# Patient Record
Sex: Male | Born: 1960 | Race: White | Hispanic: No | Marital: Married | Smoking: Current every day smoker
Health system: Southern US, Community
[De-identification: ages and names within clinical notes are randomized; demographics above are authoritative.]

## PROBLEM LIST (undated history)

## (undated) DIAGNOSIS — I10 Essential (primary) hypertension: Secondary | ICD-10-CM

## (undated) HISTORY — PX: NO PAST SURGERIES: SHX2092

---

## 2017-07-25 ENCOUNTER — Emergency Department
Admission: EM | Admit: 2017-07-25 | Discharge: 2017-07-25 | Disposition: A | Discharge status: Home / Self Care | Payer: 59 | Source: Home / Self Care | Attending: Emergency Medicine | Admitting: Emergency Medicine

## 2017-07-25 ENCOUNTER — Encounter: Payer: Self-pay | Admitting: Emergency Medicine

## 2017-07-25 DIAGNOSIS — L02512 Cutaneous abscess of left hand: Secondary | ICD-10-CM

## 2017-07-25 DIAGNOSIS — Z23 Encounter for immunization: Secondary | ICD-10-CM

## 2017-07-25 DIAGNOSIS — L03012 Cellulitis of left finger: Secondary | ICD-10-CM | POA: Diagnosis not present

## 2017-07-25 HISTORY — DX: Essential (primary) hypertension: I10

## 2017-07-25 MED ORDER — DOXYCYCLINE HYCLATE 100 MG PO CAPS: 100.0000 mg | ORAL_CAPSULE | Freq: Two times a day (BID) | ORAL | 0 refills | Status: DC

## 2017-07-25 MED ORDER — TETANUS-DIPHTH-ACELL PERTUSSIS 5-2.5-18.5 LF-MCG/0.5 IM SUSP
0.5000 mL | Freq: Once | INTRAMUSCULAR | Status: AC
  Administered 2017-07-25: 0.5 mL via INTRAMUSCULAR

## 2017-07-25 NOTE — ED Triage Notes (Signed)
[facility]/O pain in the left middle finger. States he was playing golf yesterday and noticed a pimple on his finger he opened it and woke this morning with pain and edema into the left hand.

## 2017-07-25 NOTE — ED Provider Notes (Signed)
[facility]    CSN: [id number] Arrival date & time: 07/25/17  1111     History   Chief Complaint Chief Complaint  Patient presents with  . Recurrent Skin Infections    HPI Matthew RevereWilliam Hoge is a 57 y.o. male.   HPI Patient was in his usual state of health until yesterday when he was going to play golf and noticed a pimple over the back of his left middle finger.He used a needle and was able to express a small amount of purulent material. Over the evening he has had progressive pain and swelling proximal to the pimple. He has not been ill with fevers chills and has good range of motion of his fingers and hand. Past Medical History:  Diagnosis Date  . Hypertension     There are no active problems to display for this patient.   History reviewed. No pertinent surgical history.     Home Medications    Prior to Admission medications   Medication Sig Start Date End Date Taking? Authorizing Provider  lisinopril (PRINIVIL,ZESTRIL) 20 MG tablet Take 20 mg by mouth daily.   Yes [provider]    Family History History reviewed. No pertinent family history.  Social History Social History   Tobacco Use  . Smoking status: Current Every Day Smoker  . Smokeless tobacco: Never Used  Substance Use Topics  . Alcohol use: Yes    Alcohol/week: 7.2 oz    Types: 12 Cans of beer per week  . Drug use: No     Allergies   Patient has no allergy information on record.   Review of Systems Review of Systems  Constitutional: Negative for fever.  Musculoskeletal:       Pain and swelling of his left middle finger. Swelling of the area around the third metacarpal head  Skin: Positive for rash and wound.     Physical Exam Triage Vital Signs ED Triage Vitals  Enc Vitals Group     BP 07/25/17 1132 (!) 163/93     Pulse Rate 07/25/17 1132 85     Resp 07/25/17 1132 16     Temp 07/25/17 1132 98.1 F (36.7 C)     Temp Source 07/25/17 1132 Oral     SpO2  07/25/17 1132 97 %     Weight 07/25/17 1133 223 lb 8 oz (101.4 kg)     Height 07/25/17 1133 5\' 9"  (1.753 m)     Head Circumference --      Peak Flow --      Pain Score 07/25/17 1133 3     Pain Loc --      Pain Edu? --      Excl. in GC? --    No data found.  Updated Vital Signs BP (!) 163/93 (BP Location: Right Arm)   Pulse 85   Temp 98.1 F (36.7 C) (Oral)   Resp 16   Ht 5\' 9"  (1.753 m)   Wt 223 lb 8 oz (101.4 kg)   SpO2 97%   BMI 33.01 kg/m   Visual Acuity Right Eye Distance:   Left Eye Distance:   Bilateral Distance:    Right Eye Near:   Left Eye Near:    Bilateral Near:     Physical Exam  Constitutional: He appears well-developed and well-nourished.  Musculoskeletal:  There is a 5 x 5 mm pustule noted over the extensor surface proximal phalanx left middle finger. There is swelling which extends over the head of the  third metacarpal one 1/2 inches proximally. These areas were marked with a skin marker.     UC Treatments / Results  Labs (all labs ordered are listed, but only abnormal results are displayed) Labs Reviewed - No data to display  EKG  EKG Interpretation None       Radiology No results found.  Procedures Procedures (including critical care time)  Medications Ordered in UC Medications - No data to display   Initial Impression / Assessment and Plan / UC Course  I have reviewed the triage vital signs and the nursing notes.  Pertinent labs & imaging results that were available during my care of the patient were reviewed by me and considered in my medical decision making (see chart for details). Patient has a pustule over the proximal phalanx extensor surface left middle finger. There is developing cellulitis which extends over the head of the third metacarpal. Culture was taken of this area. His tetanus immunization was updated. He was started on doxycycline twice a day with instructions to present to the emergency room if he has progressive  redness and swelling.      Final Clinical Impressions(s) / UC Diagnoses   Final diagnoses:  Abscess of left middle finger  Cellulitis of finger of left hand    ED Discharge Orders    None     Keep area clean with soap and water. If you have progressive redness and swelling extending over the wrist of the hand please go to the emergency room. Take antibiotics twice a day. Please follow-up elevated blood pressure with your primary care provider Controlled Substance Prescriptions [place] Controlled Substance Registry consulted? Not Applicable   Collene Gobble, MD 07/25/17 [phone]

## 2017-07-25 NOTE — Discharge Instructions (Signed)
Keep area clean with soap and water. If you have progressive redness and swelling extending over the wrist of the hand please go to the emergency room. Take antibiotics twice a day. Please follow-up. Elevated blood pressure with your primary care provider

## 2017-07-28 ENCOUNTER — Telehealth: Payer: Self-pay | Admitting: Emergency Medicine

## 2017-07-28 LAB — WOUND CULTURE
MICRO NUMBER:: 90305871
SPECIMEN QUALITY:: ADEQUATE

## 2017-07-28 NOTE — Telephone Encounter (Signed)
Inquired about patient's status; encourage them to call with questions/concerns; no growth on culture but complete rx for prophylactic value.

## 2018-04-18 ENCOUNTER — Encounter: Payer: Self-pay | Admitting: Sports Medicine

## 2018-04-18 ENCOUNTER — Ambulatory Visit (INDEPENDENT_AMBULATORY_CARE_PROVIDER_SITE_OTHER): Payer: 59 | Admitting: Sports Medicine

## 2018-04-18 ENCOUNTER — Ambulatory Visit (INDEPENDENT_AMBULATORY_CARE_PROVIDER_SITE_OTHER): Payer: 59

## 2018-04-18 DIAGNOSIS — M7989 Other specified soft tissue disorders: Secondary | ICD-10-CM | POA: Diagnosis not present

## 2018-04-18 DIAGNOSIS — M79644 Pain in right finger(s): Secondary | ICD-10-CM

## 2018-04-18 DIAGNOSIS — M20021 Boutonniere deformity of right finger(s): Secondary | ICD-10-CM

## 2018-04-18 NOTE — Assessment & Plan Note (Signed)
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 I am unable to get passive full extension at the PIP. X-rays do show evidence of an old fracture at the dorsal base of the middle phalanx indicating likely central slip injury. I would like him to do occupational therapy with extension splinting of the PIP, leaving the DIP free. MRI of the finger to evaluate for concurrent injuries. It is important to get full extension of the PIP before considering surgical intervention. Return to see me in 4 weeks.

## 2018-04-18 NOTE — Progress Notes (Signed)
Subjective:    I'm seeing this patient as a consultation for: Dr. Sula Rumpleaniel Myers  CC: Right hand injury  HPI: This is a pleasant 57 year old male, 6 weeks ago he was walking his dog, a 100 pound black lab, it ran and part of the leash hit him in the finger.  He had immediate pain, swelling.  But no deformity.  He went to urgent care a couple of weeks later, x-rays were done that were reportedly normal though I cannot review images.  He has had persistent pain, persistent swelling with progressive deformity of the finger itself.  Pain is moderate, persistent, localized without radiation.  Declines any swelling, pain, arthralgias before the injury.  I reviewed the past medical history, family history, social history, surgical history, and allergies today and no changes were needed.  Please see the problem list section below in epic for further details.  Past Medical History: Past Medical History:  Diagnosis Date  . Hypertension    Past Surgical History: Past Surgical History:  Procedure Laterality Date  . NO PAST SURGERIES     Social History: Social History   Socioeconomic History  . Marital status: Married    Spouse name: Not on file  . Number of children: Not on file  . Years of education: Not on file  . Highest education level: Not on file  Occupational History  . Not on file  Social Needs  . Financial resource strain: Not on file  . Food insecurity:    Worry: Not on file    Inability: Not on file  . Transportation needs:    Medical: Not on file    Non-medical: Not on file  Tobacco Use  . Smoking status: Current Every Day Smoker  . Smokeless tobacco: Never Used  Substance and Sexual Activity  . Alcohol use: Yes    Alcohol/week: 12.0 standard drinks    Types: 12 Cans of beer per week  . Drug use: No  . Sexual activity: Not on file  Lifestyle  . Physical activity:    Days per week: Not on file    Minutes per session: Not on file  . Stress: Not on file    Relationships  . Social connections:    Talks on phone: Not on file    Gets together: Not on file    Attends religious service: Not on file    Active member of club or organization: Not on file    Attends meetings of clubs or organizations: Not on file    Relationship status: Not on file  Other Topics Concern  . Not on file  Social History Narrative  . Not on file   Family History: No family history on file. Allergies: No Known Allergies Medications: See med rec.  Review of Systems: No headache, visual changes, nausea, vomiting, diarrhea, constipation, dizziness, abdominal pain, skin rash, fevers, chills, night sweats, weight loss, swollen lymph nodes, body aches, joint swelling, muscle aches, chest pain, shortness of breath, mood changes, visual or auditory hallucinations.   Objective:   General: Well Developed, well nourished, and in no acute distress.  Neuro:  Extra-ocular muscles intact, able to move all 4 extremities, sensation grossly intact.  Deep tendon reflexes tested were normal. Psych: Alert and oriented, mood congruent with affect. ENT:  Ears and nose appear unremarkable.  Hearing grossly normal. Neck: Unremarkable overall appearance, trachea midline.  No visible thyroid enlargement. Eyes: Conjunctivae and lids appear unremarkable.  Pupils equal and round. Skin: Warm and dry,  no rashes noted.  Cardiovascular: Pulses palpable, no extremity edema. Right hand: Swollen third PIP, tender to palpation over the ulnar aspect of the PIP, visible boutonniere deformity, good strength to flexion and extension at the MCP, PIP, DIP with mild weakness to extension at the PIP.  Neurovascularly intact distally collaterals are stable at the PIP.  X-rays personally reviewed, there is what appears to be bony callus over the dorsum of the middle phalangeal base.  Concern for osteomyelitis by radiology but I think this is more callus from an old occult fracture.  Impression and  Recommendations:   This case required medical decision making of moderate complexity.  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 I am unable to get passive full extension at the PIP. X-rays do show evidence of an old fracture at the dorsal base of the middle phalanx indicating likely central slip injury. I would like him to do occupational therapy with extension splinting of the PIP, leaving the DIP free. MRI of the finger to evaluate for concurrent injuries. It is important to get full extension of the PIP before considering surgical intervention. Return to see me in 4 weeks. ___________________________________________ Ihor Austin. Benjamin Stain, M.D., ABFM., CAQSM. Primary Care and Sports Medicine [facility]  Adjunct Professor of Family Medicine  [employer]

## 2018-05-09 ENCOUNTER — Ambulatory Visit: Payer: 59

## 2018-05-16 ENCOUNTER — Other Ambulatory Visit: Payer: 59

## 2018-05-16 ENCOUNTER — Ambulatory Visit: Payer: 59 | Admitting: Sports Medicine

## 2018-05-17 ENCOUNTER — Ambulatory Visit (INDEPENDENT_AMBULATORY_CARE_PROVIDER_SITE_OTHER): Payer: 59 | Admitting: Sports Medicine

## 2018-05-17 ENCOUNTER — Encounter: Payer: Self-pay | Admitting: Sports Medicine

## 2018-05-17 DIAGNOSIS — M20021 Boutonniere deformity of right finger(s): Secondary | ICD-10-CM | POA: Diagnosis not present

## 2018-05-17 NOTE — Progress Notes (Signed)
Subjective:    CC: Follow-up  HPI: This is a pleasant 57 year old male, he is here to reevaluate boutonniere's deformity, he had a severe injury with severe swelling and a great deal of lack of extension at the PIP joint.  We have done occupational therapy now aggressively, he does have a custom splint and notes near full extension now at the PIP joint.  I reviewed the past medical history, family history, social history, surgical history, and allergies today and no changes were needed.  Please see the problem list section below in epic for further details.  Past Medical History: Past Medical History:  Diagnosis Date  . Hypertension    Past Surgical History: Past Surgical History:  Procedure Laterality Date  . NO PAST SURGERIES     Social History: Social History   Socioeconomic History  . Marital status: Married    Spouse name: Not on file  . Number of children: Not on file  . Years of education: Not on file  . Highest education level: Not on file  Occupational History  . Not on file  Social Needs  . Financial resource strain: Not on file  . Food insecurity:    Worry: Not on file    Inability: Not on file  . Transportation needs:    Medical: Not on file    Non-medical: Not on file  Tobacco Use  . Smoking status: Current Every Day Smoker  . Smokeless tobacco: Never Used  Substance and Sexual Activity  . Alcohol use: Yes    Alcohol/week: 12.0 standard drinks    Types: 12 Cans of beer per week  . Drug use: No  . Sexual activity: Not on file  Lifestyle  . Physical activity:    Days per week: Not on file    Minutes per session: Not on file  . Stress: Not on file  Relationships  . Social connections:    Talks on phone: Not on file    Gets together: Not on file    Attends religious service: Not on file    Active member of club or organization: Not on file    Attends meetings of clubs or organizations: Not on file    Relationship status: Not on file  Other Topics  Concern  . Not on file  Social History Narrative  . Not on file   Family History: No family history on file. Allergies: No Known Allergies Medications: See med rec.  Review of Systems: No fevers, chills, night sweats, weight loss, chest pain, or shortness of breath.   Objective:    General: Well Developed, well nourished, and in no acute distress.  Neuro: Alert and oriented x3, extra-ocular muscles intact, sensation grossly intact.  HEENT: Normocephalic, atraumatic, pupils equal round reactive to light, neck supple, no masses, no lymphadenopathy, thyroid nonpalpable.  Skin: Warm and dry, no rashes. Cardiac: Regular rate and rhythm, no murmurs rubs or gallops, no lower extremity edema.  Respiratory: Clear to auscultation bilaterally. Not using accessory muscles, speaking in full sentences. Right middle finger: Approximately 5 to 10 degrees of extension lag at the PIP.  Still with mild to moderate swelling.  Splint is in place.  Neurovascularly intact distally.  Also some tenderness at the DIP.  Impression and Recommendations:    Boutonniere deformity of middle finger, right Boutonniere's injury approximately 10 weeks ago. We were unable to get passive full extension at the PIP. Much improved today, near full passive extension at the PIP after occupational therapy with custom  splinting. There is likely a central slip injury. Continue an additional month of occupational therapy. We could certainly consider a PIP injection, it is important to get full extension at the PIP before considering any form of surgical intervention. ___________________________________________ Ihor Austinhomas J. Benjamin Stainhekkekandam, M.D., ABFM., CAQSM. Primary Care and Sports Medicine [facility]  Adjunct Professor of Family Medicine  [employer]

## 2018-05-17 NOTE — Assessment & Plan Note (Signed)
Boutonniere's injury approximately 10 weeks ago. We were unable to get passive full extension at the PIP. Much improved today, near full passive extension at the PIP after occupational therapy with custom splinting. There is likely a central slip injury. Continue an additional month of occupational therapy. We could certainly consider a PIP injection, it is important to get full extension at the PIP before considering any form of surgical intervention.

## 2018-06-14 ENCOUNTER — Ambulatory Visit (INDEPENDENT_AMBULATORY_CARE_PROVIDER_SITE_OTHER): Payer: 59 | Admitting: Sports Medicine

## 2018-06-14 ENCOUNTER — Encounter: Payer: Self-pay | Admitting: Sports Medicine

## 2018-06-14 DIAGNOSIS — M20021 Boutonniere deformity of right finger(s): Secondary | ICD-10-CM | POA: Diagnosis not present

## 2018-06-14 NOTE — Assessment & Plan Note (Signed)
Boutonniere's injury approximately 14 weeks ago. Unable to get full passive extension at the PIP. He does likely have a central slip injury. At this point he cannot afford any more occupational therapy. Continues to have about 5 to 10 degrees of extension lag at the PIP. Injection today. Continue to work on stretching. He does feel as though he can live with it at this point.

## 2018-06-14 NOTE — Progress Notes (Signed)
Subjective:    CC: Recheck hand pain  HPI: Matthew Mccarthy returns, he is a pleasant 58 year old male with a boutonniere's deformity of the right middle finger.  He has improved his range of motion with occupational therapy, he also had a custom splint designed.  Unfortunately OT is now too expensive.  He still has discomfort at the PIP but he feels as though he can live with it.  Symptoms are moderate, persistent, localized without radiation.  I reviewed the past medical history, family history, social history, surgical history, and allergies today and no changes were needed.  Please see the problem list section below in epic for further details.  Past Medical History: Past Medical History:  Diagnosis Date  . Hypertension    Past Surgical History: Past Surgical History:  Procedure Laterality Date  . NO PAST SURGERIES     Social History: Social History   Socioeconomic History  . Marital status: Married    Spouse name: Not on file  . Number of children: Not on file  . Years of education: Not on file  . Highest education level: Not on file  Occupational History  . Not on file  Social Needs  . Financial resource strain: Not on file  . Food insecurity:    Worry: Not on file    Inability: Not on file  . Transportation needs:    Medical: Not on file    Non-medical: Not on file  Tobacco Use  . Smoking status: Current Every Day Smoker  . Smokeless tobacco: Never Used  Substance and Sexual Activity  . Alcohol use: Yes    Alcohol/week: 12.0 standard drinks    Types: 12 Cans of beer per week  . Drug use: No  . Sexual activity: Not on file  Lifestyle  . Physical activity:    Days per week: Not on file    Minutes per session: Not on file  . Stress: Not on file  Relationships  . Social connections:    Talks on phone: Not on file    Gets together: Not on file    Attends religious service: Not on file    Active member of club or organization: Not on file    Attends meetings of  clubs or organizations: Not on file    Relationship status: Not on file  Other Topics Concern  . Not on file  Social History Narrative  . Not on file   Family History: No family history on file. Allergies: No Known Allergies Medications: See med rec.  Review of Systems: No fevers, chills, night sweats, weight loss, chest pain, or shortness of breath.   Objective:    General: Well Developed, well nourished, and in no acute distress.  Neuro: Alert and oriented x3, extra-ocular muscles intact, sensation grossly intact.  HEENT: Normocephalic, atraumatic, pupils equal round reactive to light, neck supple, no masses, no lymphadenopathy, thyroid nonpalpable.  Skin: Warm and dry, no rashes. Cardiac: Regular rate and rhythm, no murmurs rubs or gallops, no lower extremity edema.  Respiratory: Clear to auscultation bilaterally. Not using accessory muscles, speaking in full sentences. Right hand: Visibly swollen third PIP.  5 to 10 degrees of extension lag.  No hyperextension at the DIP.  Procedure: Real-time Ultrasound Guided Injection of right third PIP Device: GE Logiq E  Verbal informed consent obtained.  Time-out conducted.  Noted no overlying erythema, induration, or other signs of local infection.  Skin prepped in a sterile fashion.  Local anesthesia: Topical Ethyl chloride.  With  sterile technique and under real time ultrasound guidance: 1/2 cc Kenalog 40, 1/2 cc lidocaine injected easily Completed without difficulty  Pain immediately resolved suggesting accurate placement of the medication.  Advised to call if fevers/chills, erythema, induration, drainage, or persistent bleeding.  Images permanently stored and available for review in the ultrasound unit.  Impression: Technically successful ultrasound guided injection.  Impression and Recommendations:    Boutonniere deformity of middle finger, right Boutonniere's injury approximately 14 weeks ago. Unable to get full passive  extension at the PIP. He does likely have a central slip injury. At this point he cannot afford any more occupational therapy. Continues to have about 5 to 10 degrees of extension lag at the PIP. Injection today. Continue to work on stretching. He does feel as though he can live with it at this point.  ___________________________________________ Ihor Austin. Matthew Mccarthy, M.D., ABFM., CAQSM. Primary Care and Sports Medicine [facility]  Adjunct Professor of Family Medicine  [employer]

## 2018-07-12 ENCOUNTER — Ambulatory Visit: Payer: 59 | Admitting: Sports Medicine

## 2019-05-03 ENCOUNTER — Encounter: Payer: Self-pay | Admitting: Sports Medicine

## 2019-05-03 ENCOUNTER — Other Ambulatory Visit: Payer: Self-pay

## 2019-05-03 ENCOUNTER — Ambulatory Visit (INDEPENDENT_AMBULATORY_CARE_PROVIDER_SITE_OTHER): Payer: 59

## 2019-05-03 ENCOUNTER — Ambulatory Visit (INDEPENDENT_AMBULATORY_CARE_PROVIDER_SITE_OTHER): Payer: 59 | Admitting: Sports Medicine

## 2019-05-03 DIAGNOSIS — S46012A Strain of muscle(s) and tendon(s) of the rotator cuff of left shoulder, initial encounter: Secondary | ICD-10-CM | POA: Diagnosis not present

## 2019-05-03 MED ORDER — HYDROCODONE-ACETAMINOPHEN 5-325 MG PO TABS: 1.0000 | ORAL_TABLET | Freq: Three times a day (TID) | ORAL | 0 refills | Status: DC | PRN

## 2019-05-03 NOTE — Assessment & Plan Note (Signed)
Occurred after lifting a heavy object today, I see full-thickness retracted tears of the supraspinatus and subscapularis, biceps looks good, infraspinatus and teres minor look okay. Hydrocodone for pain. X-rays today, MRI hopefully this weekend. Referral to Dr. Griffin Basil.

## 2019-05-03 NOTE — Progress Notes (Signed)
Subjective:    CC: Left shoulder injury  HPI: Matthew Mccarthy was picking up a heavy object today, he felt a pop on the back of his shoulder and then had severe pain, weakness, and inability to internally rotate or abduct.  Localized without radiation.  I reviewed the past medical history, family history, social history, surgical history, and allergies today and no changes were needed.  Please see the problem list section below in epic for further details.  Past Medical History: Past Medical History:  Diagnosis Date  . Hypertension    Past Surgical History: Past Surgical History:  Procedure Laterality Date  . NO PAST SURGERIES     Social History: Social History   Socioeconomic History  . Marital status: Married    Spouse name: Not on file  . Number of children: Not on file  . Years of education: Not on file  . Highest education level: Not on file  Occupational History  . Not on file  Tobacco Use  . Smoking status: Current Every Day Smoker  . Smokeless tobacco: Never Used  Substance and Sexual Activity  . Alcohol use: Yes    Alcohol/week: 12.0 standard drinks    Types: 12 Cans of beer per week  . Drug use: No  . Sexual activity: Not on file  Other Topics Concern  . Not on file  Social History Narrative  . Not on file   Social Determinants of Health   Financial Resource Strain:   . Difficulty of Paying Living Expenses: Not on file  Food Insecurity:   . Worried About Charity fundraiser in the Last Year: Not on file  . Ran Out of Food in the Last Year: Not on file  Transportation Needs:   . Lack of Transportation (Medical): Not on file  . Lack of Transportation (Non-Medical): Not on file  Physical Activity:   . Days of Exercise per Week: Not on file  . Minutes of Exercise per Session: Not on file  Stress:   . Feeling of Stress : Not on file  Social Connections:   . Frequency of Communication with Friends and Family: Not on file  . Frequency of Social Gatherings  with Friends and Family: Not on file  . Attends Religious Services: Not on file  . Active Member of Clubs or Organizations: Not on file  . Attends Archivist Meetings: Not on file  . Marital Status: Not on file   Family History: No family history on file. Allergies: No Known Allergies Medications: See med rec.  Review of Systems: No fevers, chills, night sweats, weight loss, chest pain, or shortness of breath.   Objective:    General: Well Developed, well nourished, and in no acute distress.  Neuro: Alert and oriented x3, extra-ocular muscles intact, sensation grossly intact.  HEENT: Normocephalic, atraumatic, pupils equal round reactive to light, neck supple, no masses, no lymphadenopathy, thyroid nonpalpable.  Skin: Warm and dry, no rashes. Cardiac: Regular rate and rhythm, no murmurs rubs or gallops, no lower extremity edema.  Respiratory: Clear to auscultation bilaterally. Not using accessory muscles, speaking in full sentences. Left shoulder: Weak abduction, extremely weak to internal rotation, mildly positive speeds and Yergason test.  Procedure: Diagnostic Ultrasound of left shoulder Device: Samsung HS60  Findings: Hypoechoic change in the region of the supraspinatus and subscapularis.  Infraspinatus and long head of the biceps looks okay. Images permanently stored and available for review in the ultrasound unit.  Impression: Full-thickness retracted tears of the supraspinatus  and subscapularis tendons.  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 Hydrocodone for pain. X-rays today, MRI hopefully this weekend. Referral to Dr. Everardo Pacific.   ___________________________________________ Ihor Austin. Benjamin Stain, M.D., ABFM., CAQSM. Primary Care and Sports  Medicine [facility]  Adjunct Professor of Family Medicine  [employer]

## 2019-05-09 ENCOUNTER — Ambulatory Visit (INDEPENDENT_AMBULATORY_CARE_PROVIDER_SITE_OTHER): Payer: 59

## 2019-05-09 ENCOUNTER — Other Ambulatory Visit: Payer: Self-pay

## 2019-05-09 DIAGNOSIS — S46012A Strain of muscle(s) and tendon(s) of the rotator cuff of left shoulder, initial encounter: Secondary | ICD-10-CM

## 2019-05-15 ENCOUNTER — Telehealth: Payer: Self-pay | Admitting: Family Medicine

## 2019-05-15 NOTE — Telephone Encounter (Signed)
Never mind. I see where patient got his results.  Thank you

## 2019-05-15 NOTE — Telephone Encounter (Signed)
lvm for results of MRI.  Thank you.

## 2019-07-12 ENCOUNTER — Ambulatory Visit (INDEPENDENT_AMBULATORY_CARE_PROVIDER_SITE_OTHER): Payer: 59 | Admitting: Physical Therapy

## 2019-07-12 ENCOUNTER — Other Ambulatory Visit: Payer: Self-pay

## 2019-07-12 ENCOUNTER — Encounter: Payer: Self-pay | Admitting: Physical Therapy

## 2019-07-12 DIAGNOSIS — R600 Localized edema: Secondary | ICD-10-CM

## 2019-07-12 DIAGNOSIS — M25512 Pain in left shoulder: Secondary | ICD-10-CM

## 2019-07-12 DIAGNOSIS — M25612 Stiffness of left shoulder, not elsewhere classified: Secondary | ICD-10-CM | POA: Diagnosis not present

## 2019-07-12 DIAGNOSIS — M6281 Muscle weakness (generalized): Secondary | ICD-10-CM | POA: Diagnosis not present

## 2019-07-12 NOTE — Patient Instructions (Signed)
Access Code: [email or web address]  URL: [email or web address]  Date: 07/12/2019  Prepared by: Raynelle Fanning Shoshanna Mcquitty   Exercises Supine Shoulder Flexion Extension AAROM with Dowel - 10 reps - 1 sets - 3-5 hold - 2x daily - 7x weekly Supine Shoulder External Internal Rotation AAROM with Dowel - 10 reps - 1 sets - 3-5 hold - 2x daily - 7x weekly Supine Shoulder Abduction AAROM with Dowel - 10 reps - 1 sets - 3-5 sec hold - 2x daily - 7x weekly Standing Bilateral Shoulder Internal Rotation AAROM with Dowel - 10 reps - 1 sets - 3-5 hold - 2x daily - 7x weekly Standing Shoulder Extension with Dowel - 10 reps - 1 sets - 3-5 hold - 2x daily - 7x weekly Standing Scapular Retraction - 10 reps - 3 sets - 3-5 sec hold - 1x daily - 7x weekly

## 2019-07-12 NOTE — Therapy (Signed)
[facility]  [address] [place], [place], [address] Phone: [phone]   Fax:  [phone]  Physical Therapy Evaluation  Patient Details  Name: Matthew Mccarthy MRN: [id number] Date of Birth: 1960/06/19 Referring Provider (PT): Ophelia Charter   Encounter Date: 07/12/2019  PT End of Session - 07/12/19 1010    Visit Number  1    Number of Visits  16    Date for PT Re-Evaluation  09/06/19    Authorization Type  Aetna    PT Start Time  1015    PT Stop Time  1055    PT Time Calculation (min)  40 min    Activity Tolerance  Patient tolerated treatment well    Behavior During Therapy  [facility] for tasks assessed/performed       Past Medical History:  Diagnosis Date  . Hypertension     Past Surgical History:  Procedure Laterality Date  . NO PAST SURGERIES      There were no vitals filed for this visit.   Subjective Assessment - 07/12/19 1025    Subjective  Patient had left SAD, RCR  and biceps tenodesis on 05/29/19. He is out of the sling and has not done any exercises. Sleeping is painful. He is a side sleeper. He would like to get back to golf. He states that he has been "cheating" using his left arm to hold a paint can while painting with dominant arm.    Pertinent History  HTN    Patient Stated Goals  get full use of his arm again    Currently in Pain?  Yes    Pain Score  2     Pain Location  Shoulder    Pain Orientation  Left    Pain Descriptors / Indicators  Aching;Dull    Pain Type  Surgical pain    Pain Onset  More than a month ago    Pain Frequency  Constant    Aggravating Factors   don/doff pullover, sleeping, tucking in shirt,         [facility] PT Assessment - 07/12/19 0001      Assessment   Medical Diagnosis  Lt SAD, RCR, biceps tenodesis    Referring Provider (PT)  Dax Varkey    Onset Date/Surgical Date  05/29/19    Hand Dominance  Right    Next MD Visit  6 weeks      Precautions   Precautions  Shoulder     Type of Shoulder Precautions  RCR    Precaution Comments  see protocol      Restrictions   Weight Bearing Restrictions  Yes      Balance Screen   Has the patient fallen in the past 6 months  No    Has the patient had a decrease in activity level because of a fear of falling?   No    Is the patient reluctant to leave their home because of a fear of falling?   No      Home Film/video editor residence      Prior Function   Level of Independence  Independent    Vocation  Full time employment    Vocation Requirements  phone    Leisure  golf      Observation/Other Assessments   Focus on Therapeutic Outcomes (FOTO)   45% limited (21% limitation is goal)      Posture/Postural Control   Posture/Postural Control  Postural limitations  Postural Limitations  Rounded Shoulders;Increased thoracic kyphosis    Posture Comments  tight bil pectoralis      ROM / Strength   AROM / PROM / Strength  AROM;PROM;Strength      AROM   Overall AROM Comments  left elbow/wrist WNL    AROM Assessment Site  Shoulder    Right/Left Shoulder  Left      PROM   Overall PROM Comments  supine    PROM Assessment Site  Shoulder    Right/Left Shoulder  Left    Left Shoulder Extension  43 Degrees    Left Shoulder Flexion  111 Degrees    Left Shoulder ABduction  76 Degrees   no rotation   Left Shoulder Internal Rotation  10 Degrees   at 45 deg ABD   Left Shoulder External Rotation  40 Degrees   at side     Strength   Overall Strength Comments  not assessed      Palpation   Palpation comment  tender left infraspinatus                Objective measurements completed on examination: See above findings.              PT Education - 07/12/19 1436    Education Details  HEP; shoulder precautions including no resisted shoulder motions for 12 weeks and no WBing through LUE.    Person(s) Educated  Patient    Methods  Explanation;Demonstration;Tactile  cues;Verbal cues;Handout    Comprehension  Verbalized understanding;Returned demonstration          PT Long Term Goals - 07/12/19 1455      PT LONG TERM GOAL #1   Title  Ind with HEP for ROM and strength to restore PLOF.    Time  8    Period  Weeks    Status  New    Target Date  09/06/19      PT LONG TERM GOAL #2   Title  Patient able to perform ADLS with functional ROM and 1/10 pain or less.    Time  8    Period  Weeks    Status  New      PT LONG TERM GOAL #3   Title  Patient able to don/doff clothing without limitations.    Time  8    Period  Weeks    Status  New      PT LONG TERM GOAL #4   Title  Patient able to sleep without waking from pain.    Time  8    Period  Weeks    Status  New      PT LONG TERM GOAL #5   Title  Patient to demo 4+/5 or better left shoulder strength to restore normal LUE use.    Time  8    Period  Weeks    Status  New             Plan - 07/12/19 1438    Clinical Impression Statement  Patient presents to PT s/p Left RCR, SAD and biceps tenodesis. He is out of the sling and has had no prior PT. He has limited shoulder ROM and reports a constant dull ache in the shoulder which limits his ability to sleep. He also reports pain with donning/doffing clothing. Strength was not tested today. Posturally he has significantly rounded shoulders bil. Shoulder precautions were reviewed with patient as reported use of left arm with resistance to PT.  He also reported he tends to "over-do", so PT explained importance of allowing tissues to heal safely. He tolerated intitial TE well with instructions to stay in painfree ROM. He will benefit from PT to regain functional ROM and strength to allow for normal ADLS and return to golf.    Personal Factors and Comorbidities  Behavior Pattern   monitor for overuse   Examination-Activity Limitations  Carry;Dressing;Sleep;Reach Overhead    Examination-Participation Restrictions  Other   golf   Stability/Clinical  Decision Making  Stable/Uncomplicated    Clinical Decision Making  Low    Rehab Potential  Excellent    PT Frequency  2x / week    PT Duration  8 weeks    PT Treatment/Interventions  ADLs/Self Care Home Management;Cryotherapy;Electrical Stimulation;Moist Heat;Therapeutic activities;Therapeutic exercise;Neuromuscular re-education;Patient/family education;Manual techniques;Passive range of motion;Taping;Vasopneumatic Device    PT Next Visit Plan  AAROM progressing to AROM after 8 weeks per protocol. Periscapular, pec/lat/trapezius isometrics with arms below shoulder; Isometrics @ 8wks see protocol; No resisted lifting before 12 wks.    PT Home Exercise Plan  [email or web address]       Patient will benefit from skilled therapeutic intervention in order to improve the following deficits and impairments:  Pain, Postural dysfunction, Decreased range of motion, Decreased strength, Impaired UE functional use, Increased edema  Visit Diagnosis: Stiffness of left shoulder, not elsewhere classified - Plan: PT plan of care cert/re-cert  Acute pain of left shoulder - Plan: PT plan of care cert/re-cert  Muscle weakness (generalized) - Plan: PT plan of care cert/re-cert  Localized edema - Plan: PT plan of care cert/re-cert     Problem List Patient Active Problem List   Diagnosis Date Noted  . Traumatic complete tear of left rotator cuff 05/03/2019  . Boutonniere deformity of middle finger, right 04/18/2018    Solon Palm PT 07/12/2019, 3:02 PM  [facility] [address] [place], [place], [address] Phone: [phone]   Fax:  [phone]  Name: Matthew Mccarthy MRN: [id number] Date of Birth: [birth year]

## 2019-07-14 ENCOUNTER — Encounter: Payer: Self-pay | Admitting: Rehabilitative and Restorative Service Providers"

## 2019-07-14 ENCOUNTER — Ambulatory Visit (INDEPENDENT_AMBULATORY_CARE_PROVIDER_SITE_OTHER): Payer: 59 | Admitting: Rehabilitative and Restorative Service Providers"

## 2019-07-14 ENCOUNTER — Other Ambulatory Visit: Payer: Self-pay

## 2019-07-14 DIAGNOSIS — M25612 Stiffness of left shoulder, not elsewhere classified: Secondary | ICD-10-CM | POA: Diagnosis not present

## 2019-07-14 DIAGNOSIS — R600 Localized edema: Secondary | ICD-10-CM

## 2019-07-14 DIAGNOSIS — M6281 Muscle weakness (generalized): Secondary | ICD-10-CM

## 2019-07-14 DIAGNOSIS — M25512 Pain in left shoulder: Secondary | ICD-10-CM | POA: Diagnosis not present

## 2019-07-14 NOTE — Therapy (Signed)
[place], [place], [address] Phone: [phone]   Fax:  [phone]  Physical Therapy Treatment  Patient Details  Name: Matthew Mccarthy MRN: [id number] Date of Birth: 02-Oct-1960 Referring Provider (PT): Ophelia Charter   Encounter Date: 07/14/2019  PT End of Session - 07/14/19 1011    Visit Number  2    Number of Visits  16    Date for PT Re-Evaluation  09/06/19    Authorization Type  Aetna    PT Start Time  [phone]    PT Stop Time  1016    PT Time Calculation (min)  45 min    Activity Tolerance  Patient tolerated treatment well    Behavior During Therapy  [facility] for tasks assessed/performed       Past Medical History:  Diagnosis Date  . Hypertension     Past Surgical History:  Procedure Laterality Date  . NO PAST SURGERIES      There were no vitals filed for this visit.  Subjective Assessment - 07/14/19 0936    Subjective  The patient was sore after therapy and exercise.  Notes pain worse in the morning.    Patient Stated Goals  get full use of his arm again    Currently in Pain?  Yes    Pain Score  2     Pain Location  Shoulder    Pain Orientation  Left    Pain Descriptors / Indicators  Aching    Pain Type  Surgical pain    Pain Onset  More than a month ago    Pain Frequency  Constant    Aggravating Factors   morning         [facility] PT Assessment - 07/14/19 1000      AROM   Left Shoulder Flexion  140 Degrees   AAROM supine     PROM   Left Shoulder ABduction  90 Degrees    Left Shoulder External Rotation  42 Degrees   in scaption                  [facility] Adult PT Treatment/Exercise - 07/14/19 0941      Exercises   Exercises  Shoulder      Shoulder Exercises: Supine   External Rotation  AAROM;Left;12 reps    Flexion  AAROM;Left;12 reps    ABduction  PROM;AAROM;Left    ABduction Limitations  stopping at 90 degrees due to muscle tension, scaption x 10 reps able to  go to 100 degrees    Other Supine Exercises  shoulder cane press x 12 reps      Shoulder Exercises: Seated   Other Seated Exercises  shoulder shrug x 12 reps      Shoulder Exercises: Prone   Retraction  Strengthening;Both;12 reps    Retraction Limitations  arms at sides       Shoulder Exercises: Sidelying   Other Sidelying Exercises  R sidelying scapular protraction/retraction and D1/D2 scapular pattern with facilitation, tactile cues and resistance.      Shoulder Exercises: Standing   Internal Rotation  AAROM;Left;12 reps    Retraction  Strengthening;Both;15 reps    Other Standing Exercises  Elbow flexion x 12 reps standing with cues on shoulder position, elbow pronation/supination with elbow flexed to 90 and shoulder @ neutral.    Other Standing Exercises  neck retraction with tactile cues, shoulder blade squeezes x 12 reps      Shoulder Exercises: Isometric Strengthening  Other Isometric Exercises  also performed supine elbow press into retraction (for scapular retraction) arm at neutral.      Modalities   Modalities  Vasopneumatic      Vasopneumatic   Number Minutes Vasopneumatic   10 minutes    Vasopnuematic Location   Shoulder    Vasopneumatic Pressure  Low    Vasopneumatic Temperature   34                  PT Long Term Goals - 07/12/19 1455      PT LONG TERM GOAL #1   Title  Ind with HEP for ROM and strength to restore PLOF.    Time  8    Period  Weeks    Status  New    Target Date  09/06/19      PT LONG TERM GOAL #2   Title  Patient able to perform ADLS with functional ROM and 1/10 pain or less.    Time  8    Period  Weeks    Status  New      PT LONG TERM GOAL #3   Title  Patient able to don/doff clothing without limitations.    Time  8    Period  Weeks    Status  New      PT LONG TERM GOAL #4   Title  Patient able to sleep without waking from pain.    Time  8    Period  Weeks    Status  New      PT LONG TERM GOAL #5   Title  Patient to  demo 4+/5 or better left shoulder strength to restore normal LUE use.    Time  8    Period  Weeks    Status  New            Plan - 07/14/19 1011    Clinical Impression Statement  The patient has improved with ROM since evaluation.  Continued to emphasize no AROM against gravity or lifting at this time.  Plan to progress to isometrics on 3/8 per protocol emphasizing AAROM.    Personal Factors and Comorbidities  Behavior Pattern   monitor for overuse   Examination-Activity Limitations  Carry;Dressing;Sleep;Reach Overhead    Examination-Participation Restrictions  Other   golf   Stability/Clinical Decision Making  Stable/Uncomplicated    Rehab Potential  Excellent    PT Frequency  2x / week    PT Duration  8 weeks    PT Treatment/Interventions  ADLs/Self Care Home Management;Cryotherapy;Electrical Stimulation;Moist Heat;Therapeutic activities;Therapeutic exercise;Neuromuscular re-education;Patient/family education;Manual techniques;Passive range of motion;Taping;Vasopneumatic Device    PT Next Visit Plan  AAROM progressing to AROM after 8 weeks per protocol. Periscapular, pec/lat/trapezius isometrics with arms below shoulder; Isometrics @ 8wks see protocol; No resisted lifting before 12 wks.    PT Home Exercise Plan  [email or web address]    Consulted and Agree with Plan of Care  Patient       Patient will benefit from skilled therapeutic intervention in order to improve the following deficits and impairments:  Pain, Postural dysfunction, Decreased range of motion, Decreased strength, Impaired UE functional use, Increased edema  Visit Diagnosis: Stiffness of left shoulder, not elsewhere classified  Acute pain of left shoulder  Muscle weakness (generalized)  Localized edema     Problem List Patient Active Problem List   Diagnosis Date Noted  . Traumatic complete tear of left rotator cuff 05/03/2019  . 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 [place], [place], [address] Phone: [phone]   Fax:  [phone]  Name: Demaris Leavell MRN: [id number] Date of Birth: May 12, 1961

## 2019-07-18 ENCOUNTER — Ambulatory Visit (INDEPENDENT_AMBULATORY_CARE_PROVIDER_SITE_OTHER): Payer: 59 | Admitting: Rehabilitative and Restorative Service Providers"

## 2019-07-18 ENCOUNTER — Other Ambulatory Visit: Payer: Self-pay

## 2019-07-18 ENCOUNTER — Encounter: Payer: Self-pay | Admitting: Rehabilitative and Restorative Service Providers"

## 2019-07-18 DIAGNOSIS — M25612 Stiffness of left shoulder, not elsewhere classified: Secondary | ICD-10-CM

## 2019-07-18 DIAGNOSIS — M6281 Muscle weakness (generalized): Secondary | ICD-10-CM

## 2019-07-18 DIAGNOSIS — M25512 Pain in left shoulder: Secondary | ICD-10-CM

## 2019-07-18 DIAGNOSIS — R600 Localized edema: Secondary | ICD-10-CM

## 2019-07-18 NOTE — Therapy (Signed)
[facility] [place] [address] [place], [place], [address] Phone: [phone]   Fax:  [phone]  Physical Therapy Treatment  Patient Details  Name: Matthew Mccarthy MRN: [id number] Date of Birth: 07/07/1960 Referring Provider (PT): Ramond Marrow   Encounter Date: 07/18/2019  PT End of Session - 07/18/19 0836    Visit Number  3    Number of Visits  16    Date for PT Re-Evaluation  09/06/19    PT Start Time  0836    PT Stop Time  0922    PT Time Calculation (min)  46 min    Activity Tolerance  Patient tolerated treatment well       Past Medical History:  Diagnosis Date  . Hypertension     Past Surgical History:  Procedure Laterality Date  . NO PAST SURGERIES      There were no vitals filed for this visit.  Subjective Assessment - 07/18/19 0836    Subjective  Doing okay - just a dull pain that stays there. Doing exercises at home    Currently in Pain?  Yes    Pain Score  2     Pain Location  Shoulder    Pain Orientation  Left    Pain Descriptors / Indicators  Aching    Pain Type  Surgical pain    Pain Onset  More than a month ago    Pain Frequency  Constant         [facility] PT Assessment - 07/18/19 0001      Assessment   Medical Diagnosis  Lt SAD, RCR, biceps tenodesis    Referring Provider (PT)  Dax Everardo Pacific    Onset Date/Surgical Date  05/29/19    Hand Dominance  Right    Next MD Visit  6 weeks      PROM   Left Shoulder Flexion  140 Degrees    Left Shoulder ABduction  134 Degrees   scaption    Left Shoulder External Rotation  46 Degrees   in scaption     Palpation   Palpation comment  muscular tightness Lt shoulder girdle                    [facility] Adult PT Treatment/Exercise - 07/18/19 0001      Shoulder Exercises: Supine   Protraction Limitations  sternal lift 10 sec x 10 reps       Shoulder Exercises: Seated   Other Seated Exercises  shoulder shrug x 5 reps      Shoulder Exercises:  Standing   External Rotation  AAROM;Left;10 reps   10 sec hold using cane - elbow at 90 deg    Other Standing Exercises  scap squeeze with noodle 10 sec hold x 10 verbal and tactile cues to decrease use of upper traps and facilitate middle and lower traps       Shoulder Exercises: Pulleys   Flexion  --   10 sec hold x 10 reps scapular plane      Shoulder Exercises: Stretch   Table Stretch - Flexion  10 seconds   10 reps      Modalities   Modalities  Vasopneumatic      Vasopneumatic   Number Minutes Vasopneumatic   10 minutes    Vasopnuematic Location   Shoulder    Vasopneumatic Pressure  Low    Vasopneumatic Temperature   34      Manual Therapy   Manual therapy comments  pt supine hooklying working on Lt shoulder girdle     Joint Mobilization  gentle circumduction Lt [place] joint     Soft tissue mobilization  working through [employer] shoulder girdle - upper trap/leveator; pecs; deltoid; biceps; teres     Scapular Mobilization  Lt     Passive ROM  Lt shoulder flexioinin scapular plane; scaption; ER in neutral scapular plane     Manual Traction  gentle traction Lt UE through long arm for relaxation              PT Education - 07/18/19 0858    Education Details  HEP    Person(s) Educated  Patient    Methods  Explanation;Demonstration;Tactile cues;Verbal cues;Handout    Comprehension  Verbalized understanding;Returned demonstration;Verbal cues required;Tactile cues required          PT Long Term Goals - 07/12/19 1455      PT LONG TERM GOAL #1   Title  Ind with HEP for ROM and strength to restore PLOF.    Time  8    Period  Weeks    Status  New    Target Date  09/06/19      PT LONG TERM GOAL #2   Title  Patient able to perform ADLS with functional ROM and 1/10 pain or less.    Time  8    Period  Weeks    Status  New      PT LONG TERM GOAL #3   Title  Patient able to don/doff clothing without limitations.    Time  8    Period  Weeks    Status  New      PT LONG  TERM GOAL #4   Title  Patient able to sleep without waking from pain.    Time  8    Period  Weeks    Status  New      PT LONG TERM GOAL #5   Title  Patient to demo 4+/5 or better left shoulder strength to restore normal LUE use.    Time  8    Period  Weeks    Status  New            Plan - 07/18/19 1607    Clinical Impression Statement  Tightness noted in Lt shoulder girdle musculature. Patient demonstrates increasing PROM assessed in supine today. He added AA exercises without difficulty. Progressing well per shoulder rehab protocol.    Rehab Potential  Excellent    PT Frequency  2x / week    PT Duration  8 weeks    PT Treatment/Interventions  ADLs/Self Care Home Management;Cryotherapy;Electrical Stimulation;Moist Heat;Therapeutic activities;Therapeutic exercise;Neuromuscular re-education;Patient/family education;Manual techniques;Passive range of motion;Taping;Vasopneumatic Device    PT Next Visit Plan  AAROM progressing to AROM after 8 weeks per protocol. Periscapular, pec/lat/trapezius isometrics with arms below shoulder; Isometrics @ 8wks see protocol; No resisted lifting before 12 wks.    PT Home Exercise Plan  [email or web address]    Consulted and Agree with Plan of Care  Patient       Patient will benefit from skilled therapeutic intervention in order to improve the following deficits and impairments:     Visit Diagnosis: Stiffness of left shoulder, not elsewhere classified  Acute pain of left shoulder  Muscle weakness (generalized)  Localized edema     Problem List Patient Active Problem List   Diagnosis Date Noted  . Traumatic complete tear of left rotator cuff 05/03/2019  . Boutonniere deformity of middle finger,  right 04/18/2018    Karem Tomaso Rober Minion PT,  MPH  07/18/2019, 9:30 AM  [facility] [address] [place], [place], [address] Phone: [phone]   Fax:  [phone]  Name: Matthew Mccarthy MRN:  [id number] Date of Birth: 1960/05/28

## 2019-07-18 NOTE — Patient Instructions (Signed)
Access Code: [email or web address]  URL: [email or web address]  Date: 07/18/2019  Prepared by: Corlis Leak   Exercises  Supine Shoulder Flexion Extension AAROM with Dowel - 10 reps - 1 sets - 3-5 hold - 2x daily - 7x weekly  Supine Shoulder External Internal Rotation AAROM with Dowel - 10 reps - 1 sets - 3-5 hold - 2x daily - 7x weekly  Supine Shoulder Abduction AAROM with Dowel - 10 reps - 1 sets - 3-5 sec hold - 2x daily - 7x weekly  Standing Bilateral Shoulder Internal Rotation AAROM with Dowel - 10 reps - 1 sets - 3-5 hold - 2x daily - 7x weekly  Standing Shoulder Extension with Dowel - 10 reps - 1 sets - 3-5 hold - 2x daily - 7x weekly  Standing Scapular Retraction - 10 reps - 3 sets - 3-5 sec hold - 1x daily - 7x weekly  Seated Shoulder Flexion Towel Slide at Table Top Full Range of Motion - 10 reps - 1 sets - 10 sec hold - 2x daily - 7x weekly  Seated Shoulder Flexion AAROM with Pulley Behind - 10 reps - 1 sets - 10 sec hold - 2x daily - 7x weekly  Plus prolonged snow angel

## 2019-07-20 ENCOUNTER — Other Ambulatory Visit: Payer: Self-pay

## 2019-07-20 ENCOUNTER — Ambulatory Visit (INDEPENDENT_AMBULATORY_CARE_PROVIDER_SITE_OTHER): Payer: 59 | Admitting: Physical Therapy

## 2019-07-20 ENCOUNTER — Encounter: Payer: Self-pay | Admitting: Physical Therapy

## 2019-07-20 DIAGNOSIS — M6281 Muscle weakness (generalized): Secondary | ICD-10-CM | POA: Diagnosis not present

## 2019-07-20 DIAGNOSIS — R600 Localized edema: Secondary | ICD-10-CM

## 2019-07-20 DIAGNOSIS — M25512 Pain in left shoulder: Secondary | ICD-10-CM | POA: Diagnosis not present

## 2019-07-20 DIAGNOSIS — M25612 Stiffness of left shoulder, not elsewhere classified: Secondary | ICD-10-CM

## 2019-07-20 NOTE — Therapy (Addendum)
[facility] [place] [address]  [address] [place], [place], [address] Phone: [phone]   Fax:  [phone]  Physical Therapy Treatment  Patient Details  Name: Matthew Mccarthy MRN: [id number] Date of Birth: Apr 13, 1961 Referring Provider (PT): Ramond Marrow   Encounter Date: 07/20/2019  PT End of Session - 07/20/19 0806    Visit Number  4    Number of Visits  16    Date for PT Re-Evaluation  09/06/19    Authorization Type  Aetna    PT Start Time  0803    PT Stop Time  0848    PT Time Calculation (min)  45 min    Activity Tolerance  Patient tolerated treatment well       Past Medical History:  Diagnosis Date  . Hypertension     Past Surgical History:  Procedure Laterality Date  . NO PAST SURGERIES      There were no vitals filed for this visit.  Subjective Assessment - 07/20/19 0808    Subjective  Pt reports exercises are going ok.  Continues to be anxious to return to golf.  He can tell his motion is improving.    Currently in Pain?  No/denies    Pain Score  0-No pain   up to 5/10 with certain motions   Pain Descriptors / Indicators  Dull;Aching         [facility] PT Assessment - 07/20/19 0001      Assessment   Medical Diagnosis  Lt SAD, RCR, biceps tenodesis    Referring Provider (PT)  Dax Everardo Pacific    Onset Date/Surgical Date  05/29/19    Hand Dominance  Right    Next MD Visit  end of March      PROM   Left Shoulder External Rotation  58 Degrees   in scaption      [facility] Adult PT Treatment/Exercise - 07/20/19 0001      Self-Care   Self-Care  Scar Mobilizations    Scar Mobilizations  Pt instructed in scar mobilization utilizing cues and mirror for visual understanding of rationale; pt verbalized understanding.       Shoulder Exercises: Supine   Protraction Limitations  sternal lift 10 sec x 5 reps     External Rotation  AAROM;Left;12 reps   in scapular plane   Flexion  AAROM;Left;12 reps    ABduction  AAROM;Left;5  reps   cane   Other Supine Exercises  shoulder cane press x 12 reps      Shoulder Exercises: Standing   Internal Rotation  AAROM;Left;10 reps    Extension  AAROM;Both;10 reps      Shoulder Exercises: Pulleys   Flexion  --   10 sec hold x 10 reps   Scaption  --   10 sec hold x 10 reps     Manual Therapy   Manual therapy comments  I strip of reg Rock tape applied over Lt shoulder incisions to assist in desensitization and scar management    Soft tissue mobilization  STM to Lt levator, upper trap and rhomboid;  IASTM to Lt bicep and distal forearm to decrease fascial restrictions and improve ROM.        VASO:  To Lt shoulder for swelling and pain.  10 min, low pressure, 34 deg.       PT Education - 07/20/19 1809    Education Details  scar mobilization; reviewed rehab protocol precautions. Ktape rationale and safe removal guidelines.    Person(s) Educated  Patient    Methods  Explanation          PT Long Term Goals - 07/12/19 1455      PT LONG TERM GOAL #1   Title  Ind with HEP for ROM and strength to restore PLOF.    Time  8    Period  Weeks    Status  New    Target Date  09/06/19      PT LONG TERM GOAL #2   Title  Patient able to perform ADLS with functional ROM and 1/10 pain or less.    Time  8    Period  Weeks    Status  New      PT LONG TERM GOAL #3   Title  Patient able to don/doff clothing without limitations.    Time  8    Period  Weeks    Status  New      PT LONG TERM GOAL #4   Title  Patient able to sleep without waking from pain.    Time  8    Period  Weeks    Status  New      PT LONG TERM GOAL #5   Title  Patient to demo 4+/5 or better left shoulder strength to restore normal LUE use.    Time  8    Period  Weeks    Status  New            Plan - 07/20/19 1808    Clinical Impression Statement  Pt's AAROM gradually improving. Pt reported slightly less pain in Lt ant shoulder upon returning to neutral with cane after shoulder press,  after ktape applied to area.  Progressing well within rehab protocol.    Rehab Potential  Excellent    PT Frequency  2x / week    PT Duration  8 weeks    PT Treatment/Interventions  ADLs/Self Care Home Management;Cryotherapy;Electrical Stimulation;Moist Heat;Therapeutic activities;Therapeutic exercise;Neuromuscular re-education;Patient/family education;Manual techniques;Passive range of motion;Taping;Vasopneumatic Device    PT Next Visit Plan  add isometrics and AROM at 8 wks per protocol.    PT Home Exercise Plan  [email or web address]    Consulted and Agree with Plan of Care  Patient       Patient will benefit from skilled therapeutic intervention in order to improve the following deficits and impairments:     Visit Diagnosis: Stiffness of left shoulder, not elsewhere classified  Acute pain of left shoulder  Muscle weakness (generalized)  Localized edema     Problem List Patient Active Problem List   Diagnosis Date Noted  . Traumatic complete tear of left rotator cuff 05/03/2019  . 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

## 2019-07-25 ENCOUNTER — Ambulatory Visit (INDEPENDENT_AMBULATORY_CARE_PROVIDER_SITE_OTHER): Payer: 59 | Admitting: Physical Therapy

## 2019-07-25 ENCOUNTER — Encounter: Payer: Self-pay | Admitting: Physical Therapy

## 2019-07-25 ENCOUNTER — Other Ambulatory Visit: Payer: Self-pay

## 2019-07-25 DIAGNOSIS — M25612 Stiffness of left shoulder, not elsewhere classified: Secondary | ICD-10-CM

## 2019-07-25 DIAGNOSIS — R600 Localized edema: Secondary | ICD-10-CM | POA: Diagnosis not present

## 2019-07-25 DIAGNOSIS — M25512 Pain in left shoulder: Secondary | ICD-10-CM | POA: Diagnosis not present

## 2019-07-25 DIAGNOSIS — M6281 Muscle weakness (generalized): Secondary | ICD-10-CM | POA: Diagnosis not present

## 2019-07-25 NOTE — Patient Instructions (Signed)
Access Code: [email or web address]  URL: [email or web address]  Date: 07/25/2019  Prepared by: [name]   Exercises  Supine Shoulder Flexion Extension AAROM with Dowel - 5 reps - 1 sets - 3-5 hold - 2x daily - 7x weekly  Supine Shoulder External Internal Rotation AAROM with Dowel - 5 reps - 1 sets - 3-5 hold - 2x daily - 7x weekly  Supine Shoulder Abduction AAROM with Dowel - 5 reps - 1 sets - 3-5 sec hold - 2x daily - 7x weekly  Standing Bilateral Shoulder Internal Rotation AAROM with Dowel - 5 reps - 1 sets - 3-5 hold - 2x daily - 7x weekly  Standing Shoulder Extension with Dowel - 5 reps - 1 sets - 3-5 hold - 2x daily - 7x weekly  Standing Scapular Retraction - 10 reps - 3 sets - 3-5 sec hold - 1x daily - 7x weekly  Seated Shoulder Flexion Towel Slide at Table Top Full Range of Motion - 10 reps - 1 sets - 10 sec hold - 2x daily - 7x weekly   Isometric Shoulder Extension at Wall - 10 reps - 1 sets - 5-10 hold - 2x daily - 7x weekly  Isometric Shoulder Flexion at Wall - 10 reps - 1 sets - 5-10 hold - 2x daily - 7x weekly  Isometric Shoulder Abduction at Wall - 10 reps - 1 sets - 5-10 hold - 2x daily - 7x weekly  Isometric Shoulder External Rotation at Wall - 10 reps - 1 sets - 5-10 hold - 1x daily - 7x weekly  Standing Isometric Shoulder Internal Rotation at Doorway - 10 reps - 1 sets - 5-10 hold - 1x daily - 7x weekly  L's - 5 reps - 1 sets - 3 hold - 1x daily - 7x weekly

## 2019-07-25 NOTE — Therapy (Signed)
[facility] [place] [address] [place] [address] [place], [place], [address] Phone: [phone]   Fax:  [phone]  Physical Therapy Treatment  Patient Details  Name: Matthew Mccarthy MRN: [id number] Date of Birth: [birth year] Referring Provider (PT): Ramond Marrow   Encounter Date: 07/25/2019  PT End of Session - 07/25/19 0806    Visit Number  5    Number of Visits  16    Date for PT Re-Evaluation  09/06/19    Authorization Type  Aetna    PT Start Time  0803    PT Stop Time  0846    PT Time Calculation (min)  43 min    Activity Tolerance  Patient tolerated treatment well       Past Medical History:  Diagnosis Date  . Hypertension     Past Surgical History:  Procedure Laterality Date  . NO PAST SURGERIES      There were no vitals filed for this visit.  Subjective Assessment - 07/25/19 0807    Subjective  Pt reports he no longer has constant pain in shoulder.  The tape helped a little.  He states he can almost put a plate away to a shoulder height shelf.    Currently in Pain?  No/denies    Pain Score  0-No pain         [facility] PT Assessment - 07/25/19 0001      Assessment   Medical Diagnosis  Lt SAD, RCR, biceps tenodesis    Referring Provider (PT)  Dax Varkey    Onset Date/Surgical Date  05/29/19    Hand Dominance  Right    Next MD Visit  end of March      AROM   Left Shoulder Extension  41 Degrees    Left Shoulder Flexion  110 Degrees    Left Shoulder ABduction  65 Degrees      PROM   Left Shoulder Flexion  145 Degrees   AAROM with cane in supine   Left Shoulder External Rotation  59 Degrees   supported scaption, AAROM with cane    Lt IR, thumb to belt behind back (AROM)  [facility] Adult PT Treatment/Exercise - 07/25/19 0001      Shoulder Exercises: Supine   External Rotation  AAROM;Left;12 reps   in scapular plane   Flexion  AAROM;Left;5 reps    ABduction  AAROM;Left;5 reps   cane     Shoulder Exercises: Standing   Internal Rotation  AAROM;Left;10 reps   cane   Internal Rotation Limitations  and 3 reps of AAROM with assistance of RUE behind back.     Flexion  AROM;Left;5 reps   with mirror   ABduction  AROM;Left;5 reps   with mirror   Extension  AAROM;Both;10 reps   cane   Other Standing Exercises  5 reps of L's and W's x 3 sec hold.       Shoulder Exercises: Pulleys   Flexion  --   10 sec hold x 10 reps   Scaption  --   10 sec hold x 10 reps     Shoulder Exercises: Isometric Strengthening   Flexion  5X5"    Extension  5X5"    External Rotation  5X5"    Internal Rotation  5X5"    ABduction  5X5"      Vasopneumatic   Number Minutes Vasopneumatic   10 minutes    Vasopnuematic Location   Shoulder    Vasopneumatic Pressure  Low  Vasopneumatic Temperature   34      Manual Therapy   Manual therapy comments  I strip of reg Rock tape applied over Lt shoulder incisions to assist in desensitization and scar management    Soft tissue mobilization  IASTM to Lt bicep and distal forearm to decrease fascial restrictions and improve ROM.              PT Education - 07/25/19 0839    Education Details  HEP    Person(s) Educated  Patient    Methods  Explanation;Handout;Verbal cues;Demonstration;Tactile cues    Comprehension  Verbalized understanding;Returned demonstration          PT Long Term Goals - 07/12/19 1455      PT LONG TERM GOAL #1   Title  Ind with HEP for ROM and strength to restore PLOF.    Time  8    Period  Weeks    Status  New    Target Date  09/06/19      PT LONG TERM GOAL #2   Title  Patient able to perform ADLS with functional ROM and 1/10 pain or less.    Time  8    Period  Weeks    Status  New      PT LONG TERM GOAL #3   Title  Patient able to don/doff clothing without limitations.    Time  8    Period  Weeks    Status  New      PT LONG TERM GOAL #4   Title  Patient able to sleep without waking from pain.    Time  8    Period  Weeks    Status  New       PT LONG TERM GOAL #5   Title  Patient to demo 4+/5 or better left shoulder strength to restore normal LUE use.    Time  8    Period  Weeks    Status  New            Plan - 07/25/19 6734    Clinical Impression Statement  Pt moved into next portion of rehab protocol without difficulty.  Pt's Lt shoulder AAROM gradually improving.  Pt reports some discomfort (up to 4/10) at lateral incision with all exercises.  Discomfort reduced with use of vaso at end of session.    Rehab Potential  Excellent    PT Frequency  2x / week    PT Duration  8 weeks    PT Treatment/Interventions  ADLs/Self Care Home Management;Cryotherapy;Electrical Stimulation;Moist Heat;Therapeutic activities;Therapeutic exercise;Neuromuscular re-education;Patient/family education;Manual techniques;Passive range of motion;Taping;Vasopneumatic Device    PT Next Visit Plan  continue Lt shoulder ROM per protocol; manual therapy, scapular mobilization.    PT Home Exercise Plan  [email or web address]    Consulted and Agree with Plan of Care  Patient       Patient will benefit from skilled therapeutic intervention in order to improve the following deficits and impairments:     Visit Diagnosis: Stiffness of left shoulder, not elsewhere classified  Acute pain of left shoulder  Muscle weakness (generalized)  Localized edema     Problem List Patient Active Problem List   Diagnosis Date Noted  . Traumatic complete tear of left rotator cuff 05/03/2019  . 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 [place], [place], [address] Phone: [phone]   Fax:  [phone]  Name: Matthew Mccarthy MRN: [id number] Date of Birth: 1961/02/27

## 2019-07-27 ENCOUNTER — Other Ambulatory Visit: Payer: Self-pay

## 2019-07-27 ENCOUNTER — Ambulatory Visit (INDEPENDENT_AMBULATORY_CARE_PROVIDER_SITE_OTHER): Payer: 59 | Admitting: Physical Therapy

## 2019-07-27 ENCOUNTER — Encounter: Payer: Self-pay | Admitting: Physical Therapy

## 2019-07-27 DIAGNOSIS — M6281 Muscle weakness (generalized): Secondary | ICD-10-CM | POA: Diagnosis not present

## 2019-07-27 DIAGNOSIS — M25512 Pain in left shoulder: Secondary | ICD-10-CM

## 2019-07-27 DIAGNOSIS — M25612 Stiffness of left shoulder, not elsewhere classified: Secondary | ICD-10-CM

## 2019-07-27 DIAGNOSIS — R600 Localized edema: Secondary | ICD-10-CM

## 2019-07-27 NOTE — Therapy (Signed)
[place], [place], [address] Phone: [phone]   Fax:  [phone]  Physical Therapy Treatment  Patient Details  Name: Matthew Mccarthy MRN: [id number] Date of Birth: Aug 07, 1960 Referring Provider (PT): Ophelia Charter   Encounter Date: 07/27/2019  PT End of Session - 07/27/19 0805    Visit Number  6    Number of Visits  16    Date for PT Re-Evaluation  09/06/19    Authorization Type  Aetna    PT Start Time  0802    PT Stop Time  0847    PT Time Calculation (min)  45 min    Activity Tolerance  Patient tolerated treatment well    Behavior During Therapy  [facility] for tasks assessed/performed       Past Medical History:  Diagnosis Date  . Hypertension     Past Surgical History:  Procedure Laterality Date  . NO PAST SURGERIES      There were no vitals filed for this visit.  Subjective Assessment - 07/27/19 0849    Subjective  Pt reports he notices his ROM is improving a little more each day.  He can reach his belt loop now to get dressed.    Currently in Pain?  No/denies    Pain Score  0-No pain         [facility] PT Assessment - 07/27/19 0001      Assessment   Medical Diagnosis  Lt SAD, RCR, biceps tenodesis    Referring Provider (PT)  Dax Griffin Basil    Onset Date/Surgical Date  05/29/19    Hand Dominance  Right    Next MD Visit  end of March      AROM   Left Shoulder Extension  50 Degrees    Left Shoulder Flexion  117 Degrees    Left Shoulder ABduction  84 Degrees        [facility] Adult PT Treatment/Exercise - 07/27/19 0001      Shoulder Exercises: Standing   Flexion  AROM;Left;10 reps   to 90 deg, with 3 sec pause   ABduction  AROM;Left;10 reps   to 80 deg, pausing 3 sec   Extension  AAROM;Both;10 reps   cane   Other Standing Exercises  10 reps W's x 5 sec hold.       Shoulder Exercises: Stretch   Corner Stretch  2 reps;10 seconds   limited tolerance   Internal Rotation Stretch  Limitations  RUE assisting LUE behind back with 5 sec pause, 10 reps    Wall Stretch - Flexion  5 reps   5 sec hold   Wall Stretch - ABduction  5 reps;10 seconds      Vasopneumatic   Number Minutes Vasopneumatic   10 minutes    Vasopnuematic Location   Shoulder    Vasopneumatic Pressure  Low    Vasopneumatic Temperature   34      Manual Therapy   Manual Therapy  Myofascial release    Manual therapy comments  Rock tape removed.     Joint Mobilization  gentle circumduction Lt GH joint     Soft tissue mobilization  STM to Lt shoulder     Myofascial Release  Lt ant deltoid and bicep    Passive ROM  Lt shoulder horiz abdct, ext, ER, IR, scaption        PT Long Term Goals - 07/27/19 0844      PT LONG TERM GOAL #1  Title  Ind with HEP for ROM and strength to restore PLOF.    Time  8    Period  Weeks    Status  On-going      PT LONG TERM GOAL #2   Title  Patient able to perform ADLS with functional ROM and 1/10 pain or less.    Time  8    Period  Weeks    Status  On-going      PT LONG TERM GOAL #3   Title  Patient able to don/doff clothing without limitations.    Time  8    Period  Weeks    Status  Achieved      PT LONG TERM GOAL #4   Title  Patient able to sleep without waking from pain.    Time  8    Period  Weeks    Status  Achieved      PT LONG TERM GOAL #5   Title  Patient to demo 4+/5 or better left shoulder strength to restore normal LUE use.    Time  8    Period  Weeks    Status  On-going            Plan - 07/27/19 1062    Clinical Impression Statement  Good improvement in Lt shoulder AROM after manual therapy.  Pt continues to report discomfort under incision at lateral shoulder with any exercises; goes away with rest.  Pt has met LTG 4 and 5.    Rehab Potential  Excellent    PT Frequency  2x / week    PT Duration  8 weeks    PT Treatment/Interventions  ADLs/Self Care Home Management;Cryotherapy;Electrical Stimulation;Moist Heat;Therapeutic  activities;Therapeutic exercise;Neuromuscular re-education;Patient/family education;Manual techniques;Passive range of motion;Taping;Vasopneumatic Device    PT Next Visit Plan  continue Lt shoulder ROM per protocol; manual therapy, scapular mobilization.    PT Home Exercise Plan  [email or web address]    Consulted and Agree with Plan of Care  Patient       Patient will benefit from skilled therapeutic intervention in order to improve the following deficits and impairments:     Visit Diagnosis: Stiffness of left shoulder, not elsewhere classified  Acute pain of left shoulder  Muscle weakness (generalized)  Localized edema     Problem List Patient Active Problem List   Diagnosis Date Noted  . Traumatic complete tear of left rotator cuff 05/03/2019  . 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

## 2019-08-01 ENCOUNTER — Ambulatory Visit (INDEPENDENT_AMBULATORY_CARE_PROVIDER_SITE_OTHER): Payer: 59 | Admitting: Physical Therapy

## 2019-08-01 ENCOUNTER — Other Ambulatory Visit: Payer: Self-pay

## 2019-08-01 DIAGNOSIS — M6281 Muscle weakness (generalized): Secondary | ICD-10-CM | POA: Diagnosis not present

## 2019-08-01 DIAGNOSIS — M25612 Stiffness of left shoulder, not elsewhere classified: Secondary | ICD-10-CM

## 2019-08-01 DIAGNOSIS — R600 Localized edema: Secondary | ICD-10-CM

## 2019-08-01 DIAGNOSIS — M25512 Pain in left shoulder: Secondary | ICD-10-CM

## 2019-08-01 NOTE — Therapy (Signed)
[place], [place], [address] Phone: [phone]   Fax:  [phone]  Physical Therapy Treatment  Patient Details  Name: Matthew Mccarthy MRN: [id number] Date of Birth: [birth year] Referring Provider (PT): Ophelia Charter   Encounter Date: 08/01/2019  PT End of Session - 08/01/19 0842    Visit Number  7    Number of Visits  16    Date for PT Re-Evaluation  09/06/19    Authorization Type  Aetna    PT Start Time  0801    PT Stop Time  0849    PT Time Calculation (min)  48 min    Activity Tolerance  Patient tolerated treatment well    Behavior During Therapy  [facility] for tasks assessed/performed       Past Medical History:  Diagnosis Date  . Hypertension     Past Surgical History:  Procedure Laterality Date  . NO PAST SURGERIES      There were no vitals filed for this visit.  Subjective Assessment - 08/01/19 1159    Subjective  Pt reports his Lt shoulder is more sore since completing the exercises from HEP  (5/10 with exercise, 2/10 at rest).  Otherwise no new changes.    Currently in Pain?  Yes    Pain Score  2     Pain Location  Shoulder    Pain Orientation  Left    Pain Descriptors / Indicators  Dull    Aggravating Factors   certain exercises.    Pain Relieving Factors  ice         [facility] PT Assessment - 08/01/19 0001      Assessment   Medical Diagnosis  Lt SAD, RCR, biceps tenodesis    Referring Provider (PT)  Dax Varkey    Onset Date/Surgical Date  05/29/19    Hand Dominance  Right    Next MD Visit  08/22/19      PROM   Left Shoulder Extension  49 Degrees      [facility] Adult PT Treatment/Exercise - 08/01/19 0001      Shoulder Exercises: Supine   Horizontal ABduction  Both;5 reps;AAROM   with cane    External Rotation  AAROM;Left;10 reps   in scapular plane, cane   Diagonals  AAROM;Left;10 reps   with cane     Shoulder Exercises: Sidelying   External Rotation  AROM;Left;10 reps     Flexion  AROM;Left;5 reps    ABduction  AROM;Left;10 reps      Shoulder Exercises: Standing   Flexion  AROM;Left;10 reps   to 90 deg, with 3 sec pause   ABduction  AROM;Left;10 reps   to 30 deg, pausing 3 sec   ABduction Limitations  avoiding compensatory strategies with scapula    Extension  AROM;Left;10 reps    Other Standing Exercises  10 reps W's x 5 sec hold.    mirror for feedback     Shoulder Exercises: Pulleys   Flexion  --   10 sec hold x 10 reps   Scaption  --   10 sec hold x 10 reps     Shoulder Exercises: Stretch   Internal Rotation Stretch Limitations  RUE assisting LUE behind back with 5 sec pause, 10 reps      Vasopneumatic   Number Minutes Vasopneumatic   10 minutes    Vasopnuematic Location   Shoulder    Vasopneumatic Pressure  Low    Vasopneumatic Temperature  34      Manual Therapy   Joint Mobilization  gentle circumduction Lt GH joint     Soft tissue mobilization  IASTM to Lt bicep and posterior Lt shoulder to decrease fascial restrictions and improve ROM.     Passive ROM  Lt shoulder horiz abdct, ext, ER, IR, scaption                   PT Long Term Goals - 07/27/19 0844      PT LONG TERM GOAL #1   Title  Ind with HEP for ROM and strength to restore PLOF.    Time  8    Period  Weeks    Status  On-going      PT LONG TERM GOAL #2   Title  Patient able to perform ADLS with functional ROM and 1/10 pain or less.    Time  8    Period  Weeks    Status  On-going      PT LONG TERM GOAL #3   Title  Patient able to don/doff clothing without limitations.    Time  8    Period  Weeks    Status  Achieved      PT LONG TERM GOAL #4   Title  Patient able to sleep without waking from pain.    Time  8    Period  Weeks    Status  Achieved      PT LONG TERM GOAL #5   Title  Patient to demo 4+/5 or better left shoulder strength to restore normal LUE use.    Time  8    Period  Weeks    Status  On-going            Plan - 08/01/19  7564    Clinical Impression Statement  Pt reporting 5/10 pain with exercises today; pt encouraged to dial back contraction and decrease length of holds using pain as a guide.  Pt guarded with PROM despite cues.  Progressing towards LTGs. goals.    Rehab Potential  Excellent    PT Frequency  2x / week    PT Duration  8 weeks    PT Treatment/Interventions  ADLs/Self Care Home Management;Cryotherapy;Electrical Stimulation;Moist Heat;Therapeutic activities;Therapeutic exercise;Neuromuscular re-education;Patient/family education;Manual techniques;Passive range of motion;Taping;Vasopneumatic Device    PT Next Visit Plan  continue Lt shoulder ROM per protocol; manual therapy, scapular mobilization per rehab protocol.    PT Home Exercise Plan  [email or web address]    Consulted and Agree with Plan of Care  Patient       Patient will benefit from skilled therapeutic intervention in order to improve the following deficits and impairments:  Pain, Postural dysfunction, Decreased range of motion, Decreased strength, Impaired UE functional use, Increased edema  Visit Diagnosis: Stiffness of left shoulder, not elsewhere classified  Acute pain of left shoulder  Muscle weakness (generalized)  Localized edema     Problem List Patient Active Problem List   Diagnosis Date Noted  . Traumatic complete tear of left rotator cuff 05/03/2019  . 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 [place], [place], [address] Phone: [phone]   Fax:  [phone]  Name: Matthew Mccarthy MRN: [id number] Date of Birth: [birth year]

## 2019-08-03 ENCOUNTER — Encounter: Payer: 59 | Admitting: Physical Therapy

## 2019-08-04 ENCOUNTER — Ambulatory Visit (INDEPENDENT_AMBULATORY_CARE_PROVIDER_SITE_OTHER): Payer: 59 | Admitting: Physical Therapy

## 2019-08-04 ENCOUNTER — Other Ambulatory Visit: Payer: Self-pay

## 2019-08-04 DIAGNOSIS — M25612 Stiffness of left shoulder, not elsewhere classified: Secondary | ICD-10-CM | POA: Diagnosis not present

## 2019-08-04 DIAGNOSIS — R600 Localized edema: Secondary | ICD-10-CM | POA: Diagnosis not present

## 2019-08-04 DIAGNOSIS — M25512 Pain in left shoulder: Secondary | ICD-10-CM

## 2019-08-04 DIAGNOSIS — M6281 Muscle weakness (generalized): Secondary | ICD-10-CM | POA: Diagnosis not present

## 2019-08-04 NOTE — Therapy (Signed)
[place], [place], [address] Phone: [phone]   Fax:  [phone]  Physical Therapy Treatment  Patient Details  Name: Matthew Mccarthy MRN: [id number] Date of Birth: [birth year] Referring Provider (PT): Ophelia Charter   Encounter Date: 08/04/2019  PT End of Session - 08/04/19 0851    Visit Number  8    Number of Visits  16    Date for PT Re-Evaluation  09/06/19    Authorization Type  Aetna    PT Start Time  0845    PT Stop Time  0925    PT Time Calculation (min)  40 min    Activity Tolerance  Patient tolerated treatment well    Behavior During Therapy  [facility] for tasks assessed/performed       Past Medical History:  Diagnosis Date  . Hypertension     Past Surgical History:  Procedure Laterality Date  . NO PAST SURGERIES      There were no vitals filed for this visit.  Subjective Assessment - 08/04/19 1135    Subjective  Pt reports he dialed back his exercises and he is no longer having pain at rest.  He has been massaging his incisions daily.    Patient Stated Goals  get full use of his arm again    Currently in Pain?  No/denies    Pain Score  0-No pain         [facility] PT Assessment - 08/04/19 0001      Assessment   Medical Diagnosis  Lt SAD, RCR, biceps tenodesis    Referring Provider (PT)  Dax Varkey    Onset Date/Surgical Date  05/29/19    Hand Dominance  Right    Next MD Visit  08/22/19      PROM   Right/Left Shoulder  Left    Left Shoulder Flexion  145 Degrees   AAROM with cane in supine   Left Shoulder ABduction  134 Degrees   scaption    Left Shoulder External Rotation  63 Degrees   supported scaption, AAROM with cane     [facility] Adult PT Treatment/Exercise - 08/04/19 0001      Shoulder Exercises: Supine   Horizontal ABduction  --    External Rotation  AAROM;Left;5 reps   in scapular plane, cane   Internal Rotation  --    Flexion  AAROM;Left;Both;5 reps      Shoulder  Exercises: Prone   Extension  AROM;Left;10 reps   with scap retraction to neutral   Other Prone Exercises  Lt row to neutral with cues for retraction/depression x 10       Shoulder Exercises: Standing   ABduction  AROM;Left   to 30 deg, pausing 3 sec x 8 reps   ABduction Limitations  avoiding compensatory strategies with scapula    Extension  AAROM;Both;10 reps    Other Standing Exercises  5 reps W's x 5 sec hold.       Shoulder Exercises: Pulleys   Flexion  --   10 sec hold x 10 reps   Scaption  --   10 sec hold x 10 reps     Shoulder Exercises: Stretch   Star Gazer Stretch  3 reps;20 seconds   palm to forehead, then behind head     Vasopneumatic   Number Minutes Vasopneumatic   10 minutes    Vasopnuematic Location   Shoulder    Vasopneumatic Pressure  Low    Vasopneumatic Temperature  34      Manual Therapy   Joint Mobilization  gentle circumduction Lt GH joint     Myofascial Release  Lt lat and pec     Passive ROM  Lt shoulder horiz abdct, ext, ER, IR, scaption                   PT Long Term Goals - 07/27/19 0844      PT LONG TERM GOAL #1   Title  Ind with HEP for ROM and strength to restore PLOF.    Time  8    Period  Weeks    Status  On-going      PT LONG TERM GOAL #2   Title  Patient able to perform ADLS with functional ROM and 1/10 pain or less.    Time  8    Period  Weeks    Status  On-going      PT LONG TERM GOAL #3   Title  Patient able to don/doff clothing without limitations.    Time  8    Period  Weeks    Status  Achieved      PT LONG TERM GOAL #4   Title  Patient able to sleep without waking from pain.    Time  8    Period  Weeks    Status  Achieved      PT LONG TERM GOAL #5   Title  Patient to demo 4+/5 or better left shoulder strength to restore normal LUE use.    Time  8    Period  Weeks    Status  On-going            Plan - 08/04/19 1136    Clinical Impression Statement  Pt reporting 2/10 pain with stretches/  AROM; complains of most pain when he attempts to abdct Lt arm above 50 deg with arm externally rotated (w's).  Pt's ROM progressing well; continues to be guarded with PROM.  He requires some cues throughout for posture.    Rehab Potential  Excellent    PT Frequency  2x / week    PT Duration  8 weeks    PT Treatment/Interventions  ADLs/Self Care Home Management;Cryotherapy;Electrical Stimulation;Moist Heat;Therapeutic activities;Therapeutic exercise;Neuromuscular re-education;Patient/family education;Manual techniques;Passive range of motion;Taping;Vasopneumatic Device    PT Next Visit Plan  continue Lt shoulder ROM per protocol; manual therapy    PT Home Exercise Plan  [email or web address]    Consulted and Agree with Plan of Care  Patient       Patient will benefit from skilled therapeutic intervention in order to improve the following deficits and impairments:  Pain, Postural dysfunction, Decreased range of motion, Decreased strength, Impaired UE functional use, Increased edema  Visit Diagnosis: Stiffness of left shoulder, not elsewhere classified  Acute pain of left shoulder  Muscle weakness (generalized)  Localized edema     Problem List Patient Active Problem List   Diagnosis Date Noted  . Traumatic complete tear of left rotator cuff 05/03/2019  . 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 [place], [place], [address] Phone: [phone]   Fax:  [phone]  Name: Matthew Mccarthy MRN: [id number] Date of Birth: 1960/06/16

## 2019-08-08 ENCOUNTER — Encounter: Payer: 59 | Admitting: Physical Therapy

## 2019-08-11 ENCOUNTER — Ambulatory Visit (INDEPENDENT_AMBULATORY_CARE_PROVIDER_SITE_OTHER): Payer: 59 | Admitting: Rehabilitative and Restorative Service Providers"

## 2019-08-11 ENCOUNTER — Other Ambulatory Visit: Payer: Self-pay

## 2019-08-11 ENCOUNTER — Encounter: Payer: Self-pay | Admitting: Rehabilitative and Restorative Service Providers"

## 2019-08-11 DIAGNOSIS — M6281 Muscle weakness (generalized): Secondary | ICD-10-CM | POA: Diagnosis not present

## 2019-08-11 DIAGNOSIS — M25512 Pain in left shoulder: Secondary | ICD-10-CM

## 2019-08-11 DIAGNOSIS — M25612 Stiffness of left shoulder, not elsewhere classified: Secondary | ICD-10-CM | POA: Diagnosis not present

## 2019-08-11 DIAGNOSIS — R600 Localized edema: Secondary | ICD-10-CM

## 2019-08-11 NOTE — Therapy (Signed)
[facility] [place] [address] [place] [address] [place], [place], [address] Phone: [phone]   Fax:  [phone]  Physical Therapy Treatment  Patient Details  Name: Matthew Mccarthy MRN: [id number] Date of Birth: November 30, 1960 Referring Provider (PT): Ramond Marrow   Encounter Date: 08/11/2019  PT End of Session - 08/11/19 0805    Visit Number  9    Number of Visits  16    Date for PT Re-Evaluation  09/06/19    PT Start Time  0803    PT Stop Time  0850    PT Time Calculation (min)  47 min       Past Medical History:  Diagnosis Date  . Hypertension     Past Surgical History:  Procedure Laterality Date  . NO PAST SURGERIES      There were no vitals filed for this visit.  Subjective Assessment - 08/11/19 0806    Subjective  Bend to get someting from under car and put some weight on the arm which hurt.    Currently in Pain?  No/denies                       [facility] Adult PT Treatment/Exercise - 08/11/19 0001      Shoulder Exercises: Seated   Retraction  Strengthening;Both;10 reps   10 sec hold    Other Seated Exercises  shoulder rolls back       Shoulder Exercises: Standing   External Rotation  AAROM;Left;10 reps   standing with noodle - elbow at side    Flexion  AROM;Left;10 reps   w/ noodle    ABduction  AROM;Left;10 reps   to 30 deg, pausing 3 sec w/ noodle    Other Standing Exercises  scap squeeze x 10; L's x 10 ; W's x 10       Shoulder Exercises: Pulleys   Flexion  --   10 sec hold x 10 reps   Scaption  --   10 sec hold x 10 reps     Shoulder Exercises: Therapy Ball   Flexion  Both;10 reps   rolling ball on table    Other Therapy Ball Exercises  scapular depression     Other Therapy Ball Exercises  working wiht ball between hands elbow flexion/extension; press out; turning ball; supination/pronation at chest height; diagonals - focus on scapular control       Vasopneumatic   Number Minutes Vasopneumatic    10 minutes    Vasopnuematic Location   Shoulder    Vasopneumatic Pressure  Low    Vasopneumatic Temperature   34      Manual Therapy   Joint Mobilization  circumduction Lt GH joint     Soft tissue mobilization  pecs; upper trap; teres; biceps    Scapular Mobilization  Lt     Passive ROM  Lt shoulder horiz abdct, ext, ER, IR, scaption in scapular plane                   PT Long Term Goals - 07/27/19 0844      PT LONG TERM GOAL #1   Title  Ind with HEP for ROM and strength to restore PLOF.    Time  8    Period  Weeks    Status  On-going      PT LONG TERM GOAL #2   Title  Patient able to perform ADLS with functional ROM and 1/10 pain or less.  Time  8    Period  Weeks    Status  On-going      PT LONG TERM GOAL #3   Title  Patient able to don/doff clothing without limitations.    Time  8    Period  Weeks    Status  Achieved      PT LONG TERM GOAL #4   Title  Patient able to sleep without waking from pain.    Time  8    Period  Weeks    Status  Achieved      PT LONG TERM GOAL #5   Title  Patient to demo 4+/5 or better left shoulder strength to restore normal LUE use.    Time  8    Period  Weeks    Status  On-going            Plan - 08/11/19 1950    Clinical Impression Statement  Continued tightness anterior shoulder. Working on Visual merchandiser. Shoulder tightness and limited mobility at end ranges. Tolerated PROM well    Rehab Potential  Excellent    PT Frequency  2x / week    PT Duration  8 weeks    PT Treatment/Interventions  ADLs/Self Care Home Management;Cryotherapy;Electrical Stimulation;Moist Heat;Therapeutic activities;Therapeutic exercise;Neuromuscular re-education;Patient/family education;Manual techniques;Passive range of motion;Taping;Vasopneumatic Device    PT Next Visit Plan  continue Lt shoulder ROM per protocol; manual therapy    PT Home Exercise Plan  [email or web address]    Consulted and Agree with Plan of Care  Patient       Patient will  benefit from skilled therapeutic intervention in order to improve the following deficits and impairments:     Visit Diagnosis: Stiffness of left shoulder, not elsewhere classified  Acute pain of left shoulder  Muscle weakness (generalized)  Localized edema     Problem List Patient Active Problem List   Diagnosis Date Noted  . Traumatic complete tear of left rotator cuff 05/03/2019  . 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

## 2019-08-14 ENCOUNTER — Encounter: Payer: Self-pay | Admitting: Rehabilitative and Restorative Service Providers"

## 2019-08-14 ENCOUNTER — Other Ambulatory Visit: Payer: Self-pay

## 2019-08-14 ENCOUNTER — Ambulatory Visit (INDEPENDENT_AMBULATORY_CARE_PROVIDER_SITE_OTHER): Payer: 59 | Admitting: Rehabilitative and Restorative Service Providers"

## 2019-08-14 DIAGNOSIS — M6281 Muscle weakness (generalized): Secondary | ICD-10-CM | POA: Diagnosis not present

## 2019-08-14 DIAGNOSIS — M25512 Pain in left shoulder: Secondary | ICD-10-CM

## 2019-08-14 DIAGNOSIS — R600 Localized edema: Secondary | ICD-10-CM

## 2019-08-14 DIAGNOSIS — M25612 Stiffness of left shoulder, not elsewhere classified: Secondary | ICD-10-CM

## 2019-08-14 NOTE — Therapy (Signed)
[facility] [place] [address] [place] [address] [place], [place], [address] Phone: [phone]   Fax:  [phone]  Physical Therapy Treatment  Patient Details  Name: Matthew Mccarthy MRN: [id number] Date of Birth: December 24, 1960 Referring Provider (PT): Ramond Marrow   Encounter Date: 08/14/2019  PT End of Session - 08/14/19 0843    Visit Number  10    Number of Visits  16    Date for PT Re-Evaluation  09/06/19    PT Start Time  0843    PT Stop Time  0930    PT Time Calculation (min)  47 min    Activity Tolerance  Patient tolerated treatment well       Past Medical History:  Diagnosis Date  . Hypertension     Past Surgical History:  Procedure Laterality Date  . NO PAST SURGERIES      There were no vitals filed for this visit.  Subjective Assessment - 08/14/19 0843    Subjective  Sore after last visit but no increase in pain. Working on exercises. Returns to MD next Tuesday 08/22/19    Currently in Pain?  No/denies         [facility] PT Assessment - 08/14/19 0001      Assessment   Medical Diagnosis  Lt SAD, RCR, biceps tenodesis    Referring Provider (PT)  Dax Varkey    Onset Date/Surgical Date  05/29/19    Next MD Visit  08/22/19      Palpation   Palpation comment  muscular tightness Lt shoulder girdle - biceps tendon area; pecs; upper trap; leveator                    [facility] Adult PT Treatment/Exercise - 08/14/19 0001      Shoulder Exercises: Seated   Other Seated Exercises  shoulder rolls back     Other Seated Exercises  working with stick - push/pull; circles; rolliing on thighs; holding at chest height for rotation; push pull holding stick in both hands;       Shoulder Exercises: Standing   Flexion  AROM;Left;10 reps   w/ noodle    ABduction  AROM;Left;10 reps   to 30 deg, pausing 3 sec w/ noodle    Other Standing Exercises  scap squeeze x 10; L's x 10 ; W's x 10       Shoulder Exercises: Pulleys   Flexion  --    10 sec hold x 10 reps   Scaption  --   10 sec hold x 10 reps     Shoulder Exercises: Therapy Ball   Flexion  Both;10 reps   rolling large ball on table    Right/Left Limitations  rolling ball on wall ~ shoulder height up/down; side to side; circle CW/CCW ~10 reps each     Other Therapy Ball Exercises  scapular depression     Other Therapy Ball Exercises  working with ball between hands elbow flexion/extension; press out; turning ball; supination/pronation at chest height; diagonals - focus on scapular control       Vasopneumatic   Number Minutes Vasopneumatic   10 minutes    Vasopnuematic Location   Shoulder    Vasopneumatic Pressure  Low    Vasopneumatic Temperature   34      Manual Therapy   Joint Mobilization  circumduction Lt GH joint     Soft tissue mobilization  pecs; upper trap; teres; biceps    Scapular Mobilization  Lt  Passive ROM  Lt shoulder horiz abdct, ext, ER, IR, scaption in scapular plane              PT Education - 08/14/19 0924    Education Details  DN    Person(s) Educated  Patient    Methods  Explanation;Handout    Comprehension  Verbalized understanding          PT Long Term Goals - 07/27/19 0844      PT LONG TERM GOAL #1   Title  Ind with HEP for ROM and strength to restore PLOF.    Time  8    Period  Weeks    Status  On-going      PT LONG TERM GOAL #2   Title  Patient able to perform ADLS with functional ROM and 1/10 pain or less.    Time  8    Period  Weeks    Status  On-going      PT LONG TERM GOAL #3   Title  Patient able to don/doff clothing without limitations.    Time  8    Period  Weeks    Status  Achieved      PT LONG TERM GOAL #4   Title  Patient able to sleep without waking from pain.    Time  8    Period  Weeks    Status  Achieved      PT LONG TERM GOAL #5   Title  Patient to demo 4+/5 or better left shoulder strength to restore normal LUE use.    Time  8    Period  Weeks    Status  On-going             Plan - 08/14/19 1884    Clinical Impression Statement  Some soreness following last visit - no pain. Continue rehab per protocol. Continues to demonstrate limited ROM at end ranges - decreased mobilty. Discussed possible trial of DN with MD approval    Rehab Potential  Excellent    PT Frequency  2x / week    PT Duration  8 weeks    PT Treatment/Interventions  ADLs/Self Care Home Management;Cryotherapy;Electrical Stimulation;Moist Heat;Therapeutic activities;Therapeutic exercise;Neuromuscular re-education;Patient/family education;Manual techniques;Passive range of motion;Taping;Vasopneumatic Device    PT Next Visit Plan  continue Lt shoulder ROM per protocol; manual therapy - PROM; trial of DN with MD approval if indicated    PT Home Exercise Plan  [email or web address]    Consulted and Agree with Plan of Care  Patient       Patient will benefit from skilled therapeutic intervention in order to improve the following deficits and impairments:     Visit Diagnosis: Stiffness of left shoulder, not elsewhere classified  Acute pain of left shoulder  Muscle weakness (generalized)  Localized edema     Problem List Patient Active Problem List   Diagnosis Date Noted  . Traumatic complete tear of left rotator cuff 05/03/2019  . 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

## 2019-08-14 NOTE — Patient Instructions (Signed)

## 2019-08-17 ENCOUNTER — Encounter: Payer: Self-pay | Admitting: Physical Therapy

## 2019-08-17 ENCOUNTER — Other Ambulatory Visit: Payer: Self-pay

## 2019-08-17 ENCOUNTER — Ambulatory Visit (INDEPENDENT_AMBULATORY_CARE_PROVIDER_SITE_OTHER): Payer: 59 | Admitting: Physical Therapy

## 2019-08-17 DIAGNOSIS — M25612 Stiffness of left shoulder, not elsewhere classified: Secondary | ICD-10-CM | POA: Diagnosis not present

## 2019-08-17 DIAGNOSIS — M6281 Muscle weakness (generalized): Secondary | ICD-10-CM | POA: Diagnosis not present

## 2019-08-17 DIAGNOSIS — M25512 Pain in left shoulder: Secondary | ICD-10-CM | POA: Diagnosis not present

## 2019-08-17 DIAGNOSIS — R600 Localized edema: Secondary | ICD-10-CM | POA: Diagnosis not present

## 2019-08-17 NOTE — Therapy (Addendum)
[place], [place], [address] Phone: [phone]   Fax:  [phone]  Physical Therapy Treatment  Patient Details  Name: Matthew Mccarthy MRN: [id number] Date of Birth: Jul 29, 1960 Referring Provider (PT): Ophelia Charter   Encounter Date: 08/17/2019  PT End of Session - 08/17/19 0922    Visit Number  11    Number of Visits  16    Date for PT Re-Evaluation  09/06/19    Authorization Type  Aetna    PT Start Time  [phone]    PT Stop Time  1019    PT Time Calculation (min)  57 min    Activity Tolerance  Patient tolerated treatment well    Behavior During Therapy  [facility] for tasks assessed/performed       Past Medical History:  Diagnosis Date  . Hypertension     Past Surgical History:  Procedure Laterality Date  . NO PAST SURGERIES      There were no vitals filed for this visit.  Subjective Assessment - 08/17/19 0922    Subjective  Second vaccine received yesterday.    Pertinent History  HTN    Patient Stated Goals  get full use of his arm again    Currently in Pain?  No/denies         [facility] PT Assessment - 08/17/19 0001      Assessment   Medical Diagnosis  Lt SAD, RCR, biceps tenodesis    Referring Provider (PT)  Dax Varkey    Onset Date/Surgical Date  05/29/19    Next MD Visit  08/22/19      AROM   Right/Left Shoulder  Left    Left Shoulder Extension  54 Degrees    Left Shoulder Flexion  128 Degrees   in standing   Left Shoulder ABduction  80 Degrees    Left Shoulder Internal Rotation  46 Degrees    Left Shoulder External Rotation  65 Degrees                   [facility] Adult PT Treatment/Exercise - 08/17/19 0001      Shoulder Exercises: Standing   External Rotation  AROM;Strengthening;15 reps   5 sec holds leaning on noodle against wall.      Shoulder Exercises: Pulleys   Flexion  2 minutes    Scaption  2 minutes      Shoulder Exercises: Isometric Strengthening   External Rotation  --   10 reps yellow band - stepping away from wall.    External Rotation Limitations  then with turning body     Internal Rotation  --   10 reps yellow band walking away from wall      Shoulder Exercises: Stretch   Other Shoulder Stretches  doorway stretch arm out to side    Other Shoulder Stretches  supine goal post to scare crow ( over pressure Lt shoulder to limited ant movement       Vasopneumatic   Number Minutes Vasopneumatic   15 minutes    Vasopnuematic Location   Shoulder    Vasopneumatic Pressure  Low    Vasopneumatic Temperature   34      Manual Therapy   Soft tissue mobilization  Lt pecs and teres minor/major    Scapular Mobilization  Lt in s/l    Passive ROM  Lt shoulder abduction and ER/IR    Manual Traction  Lt arm distraction  PT Long Term Goals - 07/27/19 0844      PT LONG TERM GOAL #1   Title  Ind with HEP for ROM and strength to restore PLOF.    Time  8    Period  Weeks    Status  On-going      PT LONG TERM GOAL #2   Title  Patient able to perform ADLS with functional ROM and 1/10 pain or less.    Time  8    Period  Weeks    Status  On-going      PT LONG TERM GOAL #3   Title  Patient able to don/doff clothing without limitations.    Time  8    Period  Weeks    Status  Achieved      PT LONG TERM GOAL #4   Title  Patient able to sleep without waking from pain.    Time  8    Period  Weeks    Status  Achieved      PT LONG TERM GOAL #5   Title  Patient to demo 4+/5 or better left shoulder strength to restore normal LUE use.    Time  8    Period  Weeks    Status  On-going            Plan - 08/17/19 1021    Clinical Impression Statement  Pt reports he no longer has much pain during the day, still unable to lie on that side. Overall ROM is coming along nicely and he is tolerating isometric work and initial scapular stability well. Progressing per protocol.  Would benefit from some DN to free up  muscular restriction to promote improved ROM in preparation for golfing.    Rehab Potential  Excellent    PT Frequency  2x / week    PT Duration  8 weeks    PT Treatment/Interventions  ADLs/Self Care Home Management;Cryotherapy;Electrical Stimulation;Moist Heat;Therapeutic activities;Therapeutic exercise;Neuromuscular re-education;Patient/family education;Manual techniques;Passive range of motion;Taping;Vasopneumatic Device    PT Next Visit Plan  continue Lt shoulder ROM per protocol; manual therapy - PROM; trial of DN with MD approval if indicated    Consulted and Agree with Plan of Care  Patient       Patient will benefit from skilled therapeutic intervention in order to improve the following deficits and impairments:  Pain, Postural dysfunction, Decreased range of motion, Decreased strength, Impaired UE functional use, Increased edema  Visit Diagnosis: Stiffness of left shoulder, not elsewhere classified  Acute pain of left shoulder  Muscle weakness (generalized)  Localized edema     Problem List Patient Active Problem List   Diagnosis Date Noted  . Traumatic complete tear of left rotator cuff 05/03/2019  . 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 [place], [place], [address] Phone: [phone]   Fax:  [phone]  Name: Marquinn Meschke MRN: [id number] Date of Birth: 1961-05-14

## 2019-08-24 ENCOUNTER — Ambulatory Visit (INDEPENDENT_AMBULATORY_CARE_PROVIDER_SITE_OTHER): Payer: 59 | Admitting: Physical Therapy

## 2019-08-24 ENCOUNTER — Other Ambulatory Visit: Payer: Self-pay

## 2019-08-24 DIAGNOSIS — R600 Localized edema: Secondary | ICD-10-CM | POA: Diagnosis not present

## 2019-08-24 DIAGNOSIS — M6281 Muscle weakness (generalized): Secondary | ICD-10-CM

## 2019-08-24 DIAGNOSIS — M25612 Stiffness of left shoulder, not elsewhere classified: Secondary | ICD-10-CM | POA: Diagnosis not present

## 2019-08-24 DIAGNOSIS — M25512 Pain in left shoulder: Secondary | ICD-10-CM | POA: Diagnosis not present

## 2019-08-24 NOTE — Patient Instructions (Signed)
Access Code: [email or web address]: [email or web address]: 04/08/2021Prepared by: [facility] [place]erc[place]  Supine Shoulder Flexion Extension AAROM with Dowel - 2 x daily - 7 x weekly - 5 reps - 1 sets - 3-5 hold  Supine Shoulder External Internal Rotation AAROM with Dowel - 2 x daily - 7 x weekly - 5 reps - 1 sets - 3-5 hold  Supine Shoulder Abduction AAROM with Dowel - 2 x daily - 7 x weekly - 5 reps - 1 sets - 3-5 sec hold  Standing Bilateral Shoulder Internal Rotation AAROM with Dowel - 2 x daily - 7 x weekly - 5 reps - 1 sets - 3-5 hold  Standing Shoulder Extension with Dowel - 2 x daily - 7 x weekly - 5 reps - 1 sets - 3-5 hold  Shoulder External Rotation with Anchored Resistance - 1 x daily - 3 x weekly - 2-3 sets - 10 reps  Single Arm Shoulder Extension with Anchored Resistance - 1 x daily - 3 x weekly - 2-3 sets - 10 reps  Standing Shoulder Internal Rotation with Anchored Resistance - 1 x daily - 3 x weekly - 2-3 sets - 10 reps  Standing Single Arm Shoulder Row with Anchored Resistance at Chest Height - 1 x daily - 3 x weekly - 2-3 sets - 10 reps  Standing Eccentric Bicep Curl Pronated then Supinated - 1 x daily - 3 x weekly - 3 sets - 10 reps  Standing Shoulder Flexion to 90 Degrees with Dumbbells - 1 x daily - 3 x weekly - 3 sets - 10 reps  Standing Shoulder and Trunk Flexion at Table - 1 x daily - 7 x weekly - 3 reps - 20 hold

## 2019-08-24 NOTE — Therapy (Addendum)
[facility] [place] [address] [place], [place], [address] Phone: [phone]   Fax:  [phone]  Physical Therapy Treatment  Patient Details  Name: Matthew Mccarthy MRN: [id number] Date of Birth: [birth year] Referring Provider (PT): Ramond Marrow   Encounter Date: 08/24/2019  PT End of Session - 08/24/19 0938    Visit Number  12    Number of Visits  16    Date for PT Re-Evaluation  09/06/19    Authorization Type  Aetna    PT Start Time  0848    PT Stop Time  0932    PT Time Calculation (min)  44 min    Activity Tolerance  Patient tolerated treatment well    Behavior During Therapy  [facility] for tasks assessed/performed       Past Medical History:  Diagnosis Date  . Hypertension     Past Surgical History:  Procedure Laterality Date  . NO PAST SURGERIES      There were no vitals filed for this visit.  Subjective Assessment - 08/24/19 0850    Subjective  Pt reports he saw surgeon.  He said he is allowed to chip and put, and has no weight restrictions. He states he has approval for DN.    Patient Stated Goals  get full use of his arm again    Currently in Pain?  No/denies    Pain Score  0-No pain         [facility] PT Assessment - 08/24/19 0001      Assessment   Medical Diagnosis  Lt SAD, RCR, biceps tenodesis    Referring Provider (PT)  Dax Varkey    Onset Date/Surgical Date  05/29/19    Next MD Visit  10/03/19      AROM   Right/Left Shoulder  Left - measurements taken in standing   Left Shoulder Extension  50 Degrees    Left Shoulder Flexion  136 Degrees    Left Shoulder ABduction  120 Degrees    Left Shoulder Internal Rotation  34 Degrees   arm abdct 90, elbow 90    Left Shoulder External Rotation  70 Degrees arm abdct 90, elbow 90       [facility] Adult PT Treatment/Exercise - 08/24/19 0001      Shoulder Exercises: Supine   ABduction  --   verbally reviewed AAROM motion and angle.      Shoulder Exercises:  Standing   External Rotation  Strengthening;Left;10 reps;Theraband    Theraband Level (Shoulder External Rotation)  Level 1 (Yellow)    Internal Rotation  Strengthening;Left;10 reps    Theraband Level (Shoulder Internal Rotation)  Level 1 (Yellow)    Flexion  Left;10 reps;Theraband;Weights    Theraband Level (Shoulder Flexion)  Level 2 (Red)   [place] forward punch   Shoulder Flexion Weight (lbs)  2   flexion to 90 deg   ABduction  AROM;Left;10 reps   to 90 deg, mirror and tactile cues for feedback   Extension  10 reps;Strengthening;Left;Theraband    Theraband Level (Shoulder Extension)  Level 1 (Yellow);Level 2 (Red)    Row  Strengthening;Left;15 reps;Theraband    Theraband Level (Shoulder Row)  Level 2 (Red)      Shoulder Exercises: Pulleys   Flexion  2 minutes    Scaption  2 minutes      Shoulder Exercises: Stretch   Internal Rotation Stretch  3 reps   15 sec hold, behind back   Table Stretch - Flexion  3 reps;20 seconds    Other Shoulder Stretches  midlevel doorway stretch x 15 sec;  overhead door frame stretch x 10 sec x 2 reps; unable to tolerate high doorway stretch (into abdct )    Other Shoulder Stretches  bilat bicep stretch holding door frame x 15 sec; switched to Lt hand holding counter, stepping forward for ant shoulder stretch.      Vasopneumatic   Number Minutes Vasopneumatic   15 minutes    Vasopnuematic Location   Shoulder    Vasopneumatic Pressure  Medium    Vasopneumatic Temperature   34 deg              PT Education - 08/24/19 1238    Education Details  HEP updated    Person(s) Educated  Patient    Methods  Explanation;Handout;Demonstration;Verbal cues;Tactile cues    Comprehension  Verbalized understanding;Returned demonstration          PT Long Term Goals - 07/27/19 0844      PT LONG TERM GOAL #1   Title  Ind with HEP for ROM and strength to restore PLOF.    Time  8    Period  Weeks    Status  On-going      PT LONG TERM GOAL #2    Title  Patient able to perform ADLS with functional ROM and 1/10 pain or less.    Time  8    Period  Weeks    Status  On-going      PT LONG TERM GOAL #3   Title  Patient able to don/doff clothing without limitations.    Time  8    Period  Weeks    Status  Achieved      PT LONG TERM GOAL #4   Title  Patient able to sleep without waking from pain.    Time  8    Period  Weeks    Status  Achieved      PT LONG TERM GOAL #5   Title  Patient to demo 4+/5 or better left shoulder strength to restore normal LUE use.    Time  8    Period  Weeks    Status  On-going            Plan - 08/24/19 1007    Clinical Impression Statement  Good improvement in Lt shoulder AROM.  He was able to tolerate new resistance exercises without difficulty and only mid-discomfort.  Discomfort eliminated with use of vaso at end of session.    Rehab Potential  Excellent    PT Frequency  2x / week    PT Duration  8 weeks    PT Treatment/Interventions  ADLs/Self Care Home Management;Cryotherapy;Electrical Stimulation;Moist Heat;Therapeutic activities;Therapeutic exercise;Neuromuscular re-education;Patient/family education;Manual techniques;Passive range of motion;Taping;Vasopneumatic Device    PT Next Visit Plan  assess response to new HEP.  DN / manual therapy as indicated.    PT Home Exercise Plan  [id number]    Consulted and Agree with Plan of Care  Patient       Patient will benefit from skilled therapeutic intervention in order to improve the following deficits and impairments:  Pain, Postural dysfunction, Decreased range of motion, Decreased strength, Impaired UE functional use, Increased edema  Visit Diagnosis: Stiffness of left shoulder, not elsewhere classified  Acute pain of left shoulder  Muscle weakness (generalized)  Localized edema     Problem List Patient Active Problem List   Diagnosis Date Noted  . Traumatic complete  tear of left rotator cuff 05/03/2019  . Boutonniere deformity  of middle finger, right 04/18/2018    Kerin Perna, PTA 08/24/19 12:39 PM  [place], [place], [address] Phone: [phone]   Fax:  [phone]  Name: Matthew Mccarthy MRN: [id number] Date of Birth: Jun 30, 1960

## 2019-08-30 ENCOUNTER — Ambulatory Visit (INDEPENDENT_AMBULATORY_CARE_PROVIDER_SITE_OTHER): Payer: 59 | Admitting: Rehabilitative and Restorative Service Providers"

## 2019-08-30 ENCOUNTER — Encounter: Payer: Self-pay | Admitting: Rehabilitative and Restorative Service Providers"

## 2019-08-30 ENCOUNTER — Other Ambulatory Visit: Payer: Self-pay

## 2019-08-30 DIAGNOSIS — R600 Localized edema: Secondary | ICD-10-CM | POA: Diagnosis not present

## 2019-08-30 DIAGNOSIS — M25612 Stiffness of left shoulder, not elsewhere classified: Secondary | ICD-10-CM | POA: Diagnosis not present

## 2019-08-30 DIAGNOSIS — M25512 Pain in left shoulder: Secondary | ICD-10-CM | POA: Diagnosis not present

## 2019-08-30 DIAGNOSIS — M6281 Muscle weakness (generalized): Secondary | ICD-10-CM | POA: Diagnosis not present

## 2019-08-30 NOTE — Therapy (Signed)
[facility] [place] [address] [place], [place], [address] Phone: [phone]   Fax:  [phone]  Physical Therapy Treatment  Patient Details  Name: Matthew Mccarthy MRN: [id number] Date of Birth: [birth year] Referring Provider (PT): Ramond Marrow   Encounter Date: 08/30/2019  PT End of Session - 08/30/19 0804    Visit Number  13    Number of Visits  16    Date for PT Re-Evaluation  09/06/19    Authorization Type  Aetna    PT Start Time  0802    PT Stop Time  0845    PT Time Calculation (min)  43 min    Activity Tolerance  Patient tolerated treatment well    Behavior During Therapy  [facility] for tasks assessed/performed       Past Medical History:  Diagnosis Date  . Hypertension     Past Surgical History:  Procedure Laterality Date  . NO PAST SURGERIES      There were no vitals filed for this visit.  Subjective Assessment - 08/30/19 0802    Subjective  The patient has muscle soreness in shoulder due to hitting golf balls.    Pertinent History  HTN    Patient Stated Goals  get full use of his arm again    Currently in Pain?  No/denies                       [facility] Adult PT Treatment/Exercise - 08/30/19 0805      Exercises   Exercises  Shoulder      Shoulder Exercises: Supine   External Rotation  PROM;AROM;Left;Strengthening    External Rotation Limitations  rhythmic stabilization, overpressure into ER    Flexion  PROM;Left    Flexion Limitations  overpressure into flexion    ABduction  PROM;Left;AROM    ABduction Limitations  with passive overpressure      Shoulder Exercises: Standing   Protraction  Strengthening;Both;12 reps    Protraction Limitations  in wall press up position scapular retraction/protraction    Flexion  Left;5 reps    Shoulder Flexion Weight (lbs)  2    Flexion Limitations  to 90 degrees    ABduction  Strengthening;Left;5 reps    Shoulder ABduction Weight (lbs)  1    ABduction  Limitations  to 70 degrees *he begins to elevate shoulder over 75 degrees abduction with weight    Extension  Strengthening;Both;12 reps    Theraband Level (Shoulder Extension)  Level 2 (Red)    Row  Strengthening;Both;12 reps    Theraband Level (Shoulder Row)  Level 2 (Red)    Other Standing Exercises  short arm flexion on wall      Shoulder Exercises: Pulleys   Flexion  2 minutes    Scaption  2 minutes      Shoulder Exercises: ROM/Strengthening   Ball on Wall  60 seconds x 2 reps CW and CCW    Rhythmic Stabilization, Supine  for abduction and ER       Shoulder Exercises: Isometric Strengthening   External Rotation  3X5"   supine   ABduction  3X5"   supine     Shoulder Exercises: Stretch   Other Shoulder Stretches  mid level doorway stretch x 30 seconds x 3 times    Other Shoulder Stretches  bicep stretch L at door frame x 2 reps x 20 seconds, L UE flexed with gentle wall lean, then rotating body to the R for  lat stretch      Modalities   Modalities  Vasopneumatic      Vasopneumatic   Number Minutes Vasopneumatic   10 minutes    Vasopnuematic Location   Shoulder    Vasopneumatic Pressure  Low    Vasopneumatic Temperature   34      Manual Therapy   Manual Therapy  Soft tissue mobilization;Joint mobilization    Manual therapy comments  in supine    Joint Mobilization  GH joint mobilization grade II for distraction, GH caudal glide.  AC joint grade II with AP mobility    Soft tissue mobilization  STM and IASTM L deltoid, anterior delts, posterior delts,                   PT Long Term Goals - 07/27/19 0844      PT LONG TERM GOAL #1   Title  Ind with HEP for ROM and strength to restore PLOF.    Time  8    Period  Weeks    Status  On-going      PT LONG TERM GOAL #2   Title  Patient able to perform ADLS with functional ROM and 1/10 pain or less.    Time  8    Period  Weeks    Status  On-going      PT LONG TERM GOAL #3   Title  Patient able to don/doff  clothing without limitations.    Time  8    Period  Weeks    Status  Achieved      PT LONG TERM GOAL #4   Title  Patient able to sleep without waking from pain.    Time  8    Period  Weeks    Status  Achieved      PT LONG TERM GOAL #5   Title  Patient to demo 4+/5 or better left shoulder strength to restore normal LUE use.    Time  8    Period  Weeks    Status  On-going            Plan - 08/30/19 0932    Clinical Impression Statement  The patient continues with limitation in shoulder abduction noting elevation of scapula when >100 degrees.  We discussed continuing scapular stabilization and strengthening at home. Plan to progress to tolerance.    Rehab Potential  Excellent    PT Frequency  2x / week    PT Duration  8 weeks    PT Treatment/Interventions  ADLs/Self Care Home Management;Cryotherapy;Electrical Stimulation;Moist Heat;Therapeutic activities;Therapeutic exercise;Neuromuscular re-education;Patient/family education;Manual techniques;Passive range of motion;Taping;Vasopneumatic Device    PT Next Visit Plan  Anticipate renewal needed on 09/07/19; progress strengthening with good mechanics, assess response to new HEP.  DN / manual therapy as indicated.    PT Home Exercise Plan  [id number]    Consulted and Agree with Plan of Care  Patient       Patient will benefit from skilled therapeutic intervention in order to improve the following deficits and impairments:  Pain, Postural dysfunction, Decreased range of motion, Decreased strength, Impaired UE functional use, Increased edema  Visit Diagnosis: Stiffness of left shoulder, not elsewhere classified  Acute pain of left shoulder  Muscle weakness (generalized)  Localized edema     Problem List Patient Active Problem List   Diagnosis Date Noted  . Traumatic complete tear of left rotator cuff 05/03/2019  . Boutonniere deformity of middle finger, right 04/18/2018  [place], [place] 08/30/2019, 9:35 AM  [facility] [place], [place], [address] Phone: [phone]   Fax:  [phone]  Name: Matthew Mccarthy MRN: [id number] Date of Birth: [birth year]

## 2019-09-01 ENCOUNTER — Encounter: Payer: Self-pay | Admitting: Rehabilitative and Restorative Service Providers"

## 2019-09-01 ENCOUNTER — Ambulatory Visit (INDEPENDENT_AMBULATORY_CARE_PROVIDER_SITE_OTHER): Payer: 59 | Admitting: Rehabilitative and Restorative Service Providers"

## 2019-09-01 ENCOUNTER — Other Ambulatory Visit: Payer: Self-pay

## 2019-09-01 DIAGNOSIS — M25512 Pain in left shoulder: Secondary | ICD-10-CM | POA: Diagnosis not present

## 2019-09-01 DIAGNOSIS — R600 Localized edema: Secondary | ICD-10-CM

## 2019-09-01 DIAGNOSIS — M6281 Muscle weakness (generalized): Secondary | ICD-10-CM | POA: Diagnosis not present

## 2019-09-01 DIAGNOSIS — M25612 Stiffness of left shoulder, not elsewhere classified: Secondary | ICD-10-CM

## 2019-09-01 NOTE — Therapy (Signed)
[place], [place], [address] Phone: [phone]   Fax:  [phone]  Physical Therapy Treatment  Patient Details  Name: Matthew Mccarthy MRN: [id number] Date of Birth: 1960/11/01 Referring Provider (PT): Ophelia Charter   Encounter Date: 09/01/2019  PT End of Session - 09/01/19 0920    Visit Number  14    Number of Visits  16    Date for PT Re-Evaluation  09/06/19    Authorization Type  Aetna    PT Start Time  0845    PT Stop Time  2951   MH end of treatment   PT Time Calculation (min)  49 min    Activity Tolerance  Patient tolerated treatment well       Past Medical History:  Diagnosis Date  . Hypertension     Past Surgical History:  Procedure Laterality Date  . NO PAST SURGERIES      There were no vitals filed for this visit.  Subjective Assessment - 09/01/19 0921    Subjective  Very sore following last visit. Better today. DN is OK - can tell that area is looser(upper trap)    Currently in Pain?  No/denies                       [facility] Adult PT Treatment/Exercise - 09/01/19 0001      Shoulder Exercises: Seated   Other Seated Exercises  axial extension/chin tuck 10 sec x 5; lateral cervical flexion 10 sec x 3 reps each side; shoulder rolls back       Shoulder Exercises: Prone   Retraction  AROM;Both;5 reps   focus on puling shd blade down and back not using upper trap     Shoulder Exercises: Pulleys   Flexion  2 minutes    Scaption  2 minutes      Shoulder Exercises: Stretch   Other Shoulder Stretches  mid level doorway stretch x 30 seconds x 3 times      Moist Heat Therapy   Number Minutes Moist Heat  10 Minutes    Moist Heat Location  Shoulder   thoracic      Manual Therapy   Manual therapy comments  skilled palpation for DN     Soft tissue mobilization  deep tissue work through the posterior shoulder girdle including upper trap, leveator, medial scapular  border; teres Lt     Myofascial Release  upper trap     Scapular Mobilization  Lt in prone        Trigger Point Dry Needling - 09/01/19 0001    Consent Given?  Yes    Education Handout Provided  Yes    Other Dry Needling  Lt     Upper Trapezius Response  Palpable increased muscle length    Levator Scapulae Response  Palpable increased muscle length    Teres major Response  Palpable increased muscle length    Teres minor Response  Palpable increased muscle length           PT Education - 09/01/19 0928    Education Details  DN    Person(s) Educated  Patient    Methods  Explanation;Demonstration;Tactile cues;Verbal cues;Handout    Comprehension  Verbalized understanding;Returned demonstration;Verbal cues required;Tactile cues required          PT Long Term Goals - 07/27/19 0844      PT LONG TERM GOAL #1   Title  Ind with HEP for  ROM and strength to restore PLOF.    Time  8    Period  Weeks    Status  On-going      PT LONG TERM GOAL #2   Title  Patient able to perform ADLS with functional ROM and 1/10 pain or less.    Time  8    Period  Weeks    Status  On-going      PT LONG TERM GOAL #3   Title  Patient able to don/doff clothing without limitations.    Time  8    Period  Weeks    Status  Achieved      PT LONG TERM GOAL #4   Title  Patient able to sleep without waking from pain.    Time  8    Period  Weeks    Status  Achieved      PT LONG TERM GOAL #5   Title  Patient to demo 4+/5 or better left shoulder strength to restore normal LUE use.    Time  8    Period  Weeks    Status  On-going            Plan - 09/01/19 0929    Clinical Impression Statement  Patient continues to demonstrate forward shoulder posture with elevation of scapulae- abduction of scapulae along the thoracic spine, head of the humerus anterior in orientation. Good response to DN and manual work with decreased muscular tightness noted following treatment.    Rehab Potential   Excellent    PT Frequency  2x / week    PT Duration  8 weeks    PT Treatment/Interventions  ADLs/Mccarthy Care Home Management;Cryotherapy;Electrical Stimulation;Moist Heat;Therapeutic activities;Therapeutic exercise;Neuromuscular re-education;Patient/family education;Manual techniques;Passive range of motion;Taping;Vasopneumatic Device    PT Next Visit Plan  Anticipate renewal needed on 09/07/19; progress strengthening with good mechanics, assess response to new HEP.  assess respoe to DN / manual therapy; added scapular retraction in prone - needs to continue to work on alignment and posterior shoulder girdle strengthening    PT Home Exercise Plan  [id number]    Consulted and Agree with Plan of Care  Patient       Patient will benefit from skilled therapeutic intervention in order to improve the following deficits and impairments:     Visit Diagnosis: Stiffness of left shoulder, not elsewhere classified  Acute pain of left shoulder  Muscle weakness (generalized)  Localized edema     Problem List Patient Active Problem List   Diagnosis Date Noted  . Traumatic complete tear of left rotator cuff 05/03/2019  . Boutonniere deformity of middle finger, right 04/18/2018    Matthew Mccarthy Rober Minion PT, MPH  09/01/2019, 9:36 AM  [facility] [address] [place] [address] [place], [place], [address] Phone: [phone]   Fax:  [phone]  Name: Matthew Mccarthy MRN: [id number] Date of Birth: 31-Jan-1961

## 2019-09-01 NOTE — Patient Instructions (Addendum)
Trigger Point Dry Needling  . What is Trigger Point Dry Needling (DN)? o DN is a physical therapy technique used to treat muscle pain and dysfunction. Specifically, DN helps deactivate muscle trigger points (muscle knots).  o A thin filiform needle is used to penetrate the skin and stimulate the underlying trigger point. The goal is for a local twitch response (LTR) to occur and for the trigger point to relax. No medication of any kind is injected during the procedure.   . What Does Trigger Point Dry Needling Feel Like?  o The procedure feels different for each individual patient. Some patients report that they do not actually feel the needle enter the skin and overall the process is not painful. Very mild bleeding may occur. However, many patients feel a deep cramping in the muscle in which the needle was inserted. This is the local twitch response.   Marland Kitchen How Will I feel after the treatment? o Soreness is normal, and the onset of soreness may not occur for a few hours. Typically this soreness does not last longer than two days.  o Bruising is uncommon, however; ice can be used to decrease any possible bruising.  o In rare cases feeling tired or nauseous after the treatment is normal. In addition, your symptoms may get worse before they get better, this period will typically not last longer than 24 hours.   . What Can I do After My Treatment? o Increase your hydration by drinking more water for the next 24 hours. o You may place ice or heat on the areas treated that have become sore, however, do not use heat on inflamed or bruised areas. Heat often brings more relief post needling. o You can continue your regular activities, but vigorous activity is not recommended initially after the treatment for 24 hours. o DN is best combined with other physical therapy such as strengthening, stretching, and other therapies.    Scapular Retraction: Abduction (Prone)    Lie with upper arms straight out from  sides, elbows bent to 90. Pinch shoulder blades together and raise arms a few inches from floor. Repeat _5-10___ times per set. Do __1-2__ sets per session. Do __1__ sessions per day. DO NOT use upper traps

## 2019-09-04 ENCOUNTER — Encounter: Payer: Self-pay | Admitting: Rehabilitative and Restorative Service Providers"

## 2019-09-04 ENCOUNTER — Ambulatory Visit (INDEPENDENT_AMBULATORY_CARE_PROVIDER_SITE_OTHER): Payer: 59 | Admitting: Rehabilitative and Restorative Service Providers"

## 2019-09-04 ENCOUNTER — Other Ambulatory Visit: Payer: Self-pay

## 2019-09-04 DIAGNOSIS — M25512 Pain in left shoulder: Secondary | ICD-10-CM | POA: Diagnosis not present

## 2019-09-04 DIAGNOSIS — M6281 Muscle weakness (generalized): Secondary | ICD-10-CM

## 2019-09-04 DIAGNOSIS — M25612 Stiffness of left shoulder, not elsewhere classified: Secondary | ICD-10-CM | POA: Diagnosis not present

## 2019-09-04 NOTE — Therapy (Signed)
[facility] [place] [address] [place], [place], [address] Phone: [phone]   Fax:  [phone]  Physical Therapy Treatment  Patient Details  Name: Matthew Mccarthy MRN: [id number] Date of Birth: 03/03/1961 Referring Provider (PT): Ramond Marrow   Encounter Date: 09/04/2019  PT End of Session - 09/04/19 0801    Visit Number  15    Number of Visits  16    Date for PT Re-Evaluation  09/06/19    PT Start Time  0758    PT Stop Time  0845    PT Time Calculation (min)  47 min    Activity Tolerance  Patient tolerated treatment well       Past Medical History:  Diagnosis Date  . Hypertension     Past Surgical History:  Procedure Laterality Date  . NO PAST SURGERIES      There were no vitals filed for this visit.  Subjective Assessment - 09/04/19 0802    Subjective  Patient reports that the DN was "okay" - seemed to help that knot    Currently in Pain?  No/denies         [facility] PT Assessment - 09/04/19 0001      Assessment   Medical Diagnosis  Lt SAD, RCR, biceps tenodesis    Referring Provider (PT)  Dax Varkey    Onset Date/Surgical Date  05/29/19    Hand Dominance  Right    Next MD Visit  10/03/19      Posture/Postural Control   Posture Comments  forward posture improved with verbal cues - continues to exhibit rounded shoulders with head of the humerus anterior in orientation       Palpation   Palpation comment  muscular tightness Lt shoulder girdle - teres/lats; biceps tendon area; pecs; upper trap; leveator                    [facility] Adult PT Treatment/Exercise - 09/04/19 0001      Shoulder Exercises: Seated   Other Seated Exercises  activation of deltoid shoulder blades down and back through partial range x 10; 2# wt x 5       Shoulder Exercises: Standing   Other Standing Exercises  biceps curls 2# wt x 10 x 2 sets - noodle along spine       Shoulder Exercises: Pulleys   Flexion  2 minutes     Scaption  2 minutes      Shoulder Exercises: Stretch   Other Shoulder Stretches  3 way doorway stretch x 30 seconds x 3 times    Other Shoulder Stretches  T stretch at wall 20 sec x 3; flexion stretch arms on doorframe 20 sec x 3       Vasopneumatic   Number Minutes Vasopneumatic   10 minutes    Vasopnuematic Location   Shoulder   Lt    Vasopneumatic Pressure  Low    Vasopneumatic Temperature   34 deg       Manual Therapy   Joint Mobilization  GH joint mobilization grade II for distraction, GH caudal glide; circumduction GH joint; AC joint grade II with AP mobility    Soft tissue mobilization  deep tissue work through the upper trap; teres/lats; pecs; deltoid; biceps areas    Myofascial Release  teres/lateral trunk     Scapular Mobilization  Lt in Rt sidelying     Passive ROM  Lt shoulder abduction in scapular plane  PT Education - 09/04/19 0807    Education Details  HEP    Person(s) Educated  Patient    Methods  Explanation;Demonstration;Tactile cues;Verbal cues;Handout    Comprehension  Verbalized understanding;Returned demonstration;Verbal cues required;Tactile cues required          PT Long Term Goals - 07/27/19 0844      PT LONG TERM GOAL #1   Title  Ind with HEP for ROM and strength to restore PLOF.    Time  8    Period  Weeks    Status  On-going      PT LONG TERM GOAL #2   Title  Patient able to perform ADLS with functional ROM and 1/10 pain or less.    Time  8    Period  Weeks    Status  On-going      PT LONG TERM GOAL #3   Title  Patient able to don/doff clothing without limitations.    Time  8    Period  Weeks    Status  Achieved      PT LONG TERM GOAL #4   Title  Patient able to sleep without waking from pain.    Time  8    Period  Weeks    Status  Achieved      PT LONG TERM GOAL #5   Title  Patient to demo 4+/5 or better left shoulder strength to restore normal LUE use.    Time  8    Period  Weeks    Status  On-going             Plan - 09/04/19 0845    Clinical Impression Statement  Less palpable tightness through the upper trap and leveator; continued tightness teres/pecs/biceps. Added stretching and strengthening exercises without difficulty. May benefit from DN to teres area if tightness persists. Will progress with strengthening.    Rehab Potential  Excellent    PT Frequency  2x / week    PT Duration  8 weeks    PT Treatment/Interventions  ADLs/Self Care Home Management;Cryotherapy;Electrical Stimulation;Moist Heat;Therapeutic activities;Therapeutic exercise;Neuromuscular re-education;Patient/family education;Manual techniques;Passive range of motion;Taping;Vasopneumatic Device    PT Next Visit Plan  assess for renewal; progress strengthening with good mechanics, assess response to new HEP.  continue DN / manual therapy as needed; - needs to continue to work on alignment and posterior shoulder girdle strengthening    PT [place]; written exercises    Consulted and Agree with Plan of Care  Patient       Patient will benefit from skilled therapeutic intervention in order to improve the following deficits and impairments:     Visit Diagnosis: Stiffness of left shoulder, not elsewhere classified  Acute pain of left shoulder  Muscle weakness (generalized)     Problem List Patient Active Problem List   Diagnosis Date Noted  . Traumatic complete tear of left rotator cuff 05/03/2019  . 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

## 2019-09-04 NOTE — Patient Instructions (Signed)
Stand facing wall - arms out to side in a T  Drop right arm and slowly turn to right to stretch across the front of the the left shoulder  Hold 20 sec working toward 30 sec  3 reps - be careful  Standing at doorway place both hands on top of door frame and step through slowly  Hold 20 -30 sec  3 reps  - be careful

## 2019-09-06 ENCOUNTER — Encounter: Payer: Self-pay | Admitting: Physical Therapy

## 2019-09-06 ENCOUNTER — Ambulatory Visit (INDEPENDENT_AMBULATORY_CARE_PROVIDER_SITE_OTHER): Payer: 59 | Admitting: Physical Therapy

## 2019-09-06 ENCOUNTER — Other Ambulatory Visit: Payer: Self-pay

## 2019-09-06 DIAGNOSIS — M25512 Pain in left shoulder: Secondary | ICD-10-CM | POA: Diagnosis not present

## 2019-09-06 DIAGNOSIS — M25612 Stiffness of left shoulder, not elsewhere classified: Secondary | ICD-10-CM

## 2019-09-06 DIAGNOSIS — M6281 Muscle weakness (generalized): Secondary | ICD-10-CM

## 2019-09-06 DIAGNOSIS — R600 Localized edema: Secondary | ICD-10-CM

## 2019-09-06 NOTE — Therapy (Addendum)
[place], [place], [address] Phone: [phone]   Fax:  [phone]  Physical Therapy Treatment  Patient Details  Name: Matthew Mccarthy MRN: [id number] Date of Birth: Oct 02, 1960 Referring Provider (PT): Ophelia Charter   Encounter Date: 09/06/2019  PT End of Session - 09/06/19 0802    Visit Number  16    Number of Visits  28    Date for PT Re-Evaluation  10/18/19    PT Start Time  0802    PT Stop Time  0848    PT Time Calculation (min)  46 min    Activity Tolerance  Patient tolerated treatment well    Behavior During Therapy  [facility] for tasks assessed/performed       Past Medical History:  Diagnosis Date  . Hypertension     Past Surgical History:  Procedure Laterality Date  . NO PAST SURGERIES      There were no vitals filed for this visit.  Subjective Assessment - 09/06/19 0806    Subjective  Pt reports he is still limited with lifting arm to side.  He did some chipping, putting, and swinging of golf club.    Patient Stated Goals  get full use of his arm again    Currently in Pain?  No/denies    Pain Score  0-No pain         [facility] PT Assessment - 09/06/19 0001      Assessment   Medical Diagnosis  Lt SAD, RCR, biceps tenodesis    Referring Provider (PT)  Dax Griffin Basil    Onset Date/Surgical Date  05/29/19    Hand Dominance  Right    Next MD Visit  10/03/19      AROM   Right/Left Shoulder  Left    Left Shoulder Extension  53 Degrees    Left Shoulder Flexion  142 Degrees    Left Shoulder ABduction  116 Degrees    Left Shoulder Internal Rotation  --   thumb to L1   Left Shoulder External Rotation  72 Degrees      Strength   Strength Assessment Site  Shoulder    Right/Left Shoulder  Left    Left Shoulder Flexion  4-/5   with pain   Left Shoulder Extension  5/5    Left Shoulder ABduction  3+/5   with pain   Left Shoulder Internal Rotation  4+/5    Left Shoulder External Rotation   4+/5       [facility] Adult PT Treatment/Exercise - 09/06/19 0001      Shoulder Exercises: Supine   Horizontal ABduction  Both;Strengthening;10 reps    Theraband Level (Shoulder Horizontal ABduction)  Level 3 (Green)    Diagonals  Strengthening;Left;10 reps    Theraband Level (Shoulder Diagonals)  Level 2 (Red)      Shoulder Exercises: Sidelying   Other Sidelying Exercises  Rt sidelying, open book with Lt thoracic rotation with horiz abdct x 6 reps, repeated 5 x with red band       Shoulder Exercises: Standing   External Rotation  Both;10 reps;Theraband    Theraband Level (Shoulder External Rotation)  Level 3 (Green)    External Rotation Limitations  some compensation observed during last 3 reps with fatigue    ABduction  Strengthening;Left;5 reps;Weights    Shoulder ABduction Weight (lbs)  1   range to tolerance (40 deg)     Shoulder Exercises: Pulleys   Flexion  2 minutes  Scaption  2 minutes    Other Pulley Exercises  LUE IR x 2 min       Shoulder Exercises: ROM/Strengthening   Lat Pull  3 plate;10 reps    Cybex Row  2 plate;10 reps    Other ROM/Strengthening Exercises  tricep pull down with 2 plates x 10     Other ROM/Strengthening Exercises  plank on forearm  x 5-6 sec x 2 reps; high plank x 2 reps of 10 sec       Shoulder Exercises: Stretch   Other Shoulder Stretches  midlevel and high doorway stretch x 15 sec each; overhead doorway stretch x 10 sec x 2 rpes       Vasopneumatic   Number Minutes Vasopneumatic   10 minutes    Vasopnuematic Location   Shoulder   Lt    Vasopneumatic Pressure  Low    Vasopneumatic Temperature   34 deg              PT Education - 09/06/19 0856    Education Details  updated HEP    Person(s) Educated  Patient    Methods  Explanation;Demonstration;Tactile cues;Verbal cues;Handout    Comprehension  Verbalized understanding;Returned demonstration          PT Long Term Goals - 09/06/19 0854      PT LONG TERM GOAL #1   Title   Ind with HEP for ROM and strength to restore PLOF.    Time  8    Period  Weeks    Status  Achieved      PT LONG TERM GOAL #2   Title  Patient able to perform ADLS with functional ROM and 1/10 pain or less.    Time  8    Period  Weeks    Status  Achieved      PT LONG TERM GOAL #3   Title  Patient able to don/doff clothing without limitations.    Time  8    Period  Weeks    Status  Achieved      PT LONG TERM GOAL #4   Title  Patient able to sleep without waking from pain.    Time  8    Period  Weeks    Status  Achieved      PT LONG TERM GOAL #5   Title  Patient to demo 4+/5 or better left shoulder strength to restore normal LUE use.    Time  8    Period  Weeks    Status  Partially Met      PT LONG TERM GOAL #6   Title  Patient to demonstrate increased thoracic extension to improve shoulder function    Time  6    Period  Weeks    Status  New    Target Date  10/18/19      PT LONG TERM GOAL #7   Title  Patient return to golf with modification if needed    Time  6    Period  Weeks    Status  New    Target Date  10/18/19            Plan - 09/06/19 0857    Clinical Impression Statement  Pt is now 14 wks s/p rotator cuff repair. He continues to be limited with Lt shoulder abdct and ER AROM.  Pt's strength is progressing; he tolerated new resistance exercises well.  He reports pain up to 5-6/10 with abdct and ER stretches;  resolved with short rest breaks.  Pt has met LTG#1 and 2, and partially met LTG #5.  Pt will benefit from additional visits to maximize functional mobility and return to playing golf.    Rehab Potential  Excellent    PT Frequency  2x / week    PT Duration  8 weeks    PT Treatment/Interventions  ADLs/Self Care Home Management;Cryotherapy;Electrical Stimulation;Moist Heat;Therapeutic activities;Therapeutic exercise;Neuromuscular re-education;Patient/family education;Manual techniques;Passive range of motion;Taping;Vasopneumatic Device    PT Next Visit  Plan  progress strengthening for Lt shoulder.    PT Home Exercise Plan  [email or web address]; written exercises    Consulted and Agree with Plan of Care  Patient       Patient will benefit from skilled therapeutic intervention in order to improve the following deficits and impairments:  Pain, Postural dysfunction, Decreased range of motion, Decreased strength, Impaired UE functional use, Increased edema  Visit Diagnosis: Stiffness of left shoulder, not elsewhere classified  Acute pain of left shoulder  Muscle weakness (generalized)  Localized edema     Problem List Patient Active Problem List   Diagnosis Date Noted  . Traumatic complete tear of left rotator cuff 05/03/2019  . 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 Helene Kelp PT, MPH 09/06/19 10:35 AM   [place], [place], [address] Phone: [phone]   Fax:  [phone]  Name: Matthew Mccarthy MRN: [id number] Date of Birth: 1960/05/22

## 2019-09-06 NOTE — Patient Instructions (Signed)
Access Code: [email or web address]: [email or web address]: 04/21/2021Prepared by: [facility] [place]ercises  Supine Shoulder Abduction AAROM with [place] - 2 x daily - 7 x weekly - 5 reps - 1 sets - 3-5 sec hold  Shoulder External Rotation with Anchored Resistance - 1 x daily - 3 x weekly - 2-3 sets - 10 reps  Standing Eccentric Bicep Curl Pronated then Supinated - 1 x daily - 3 x weekly - 3 sets - 10 reps  Standing Shoulder Flexion to 90 Degrees with Dumbbells - 1 x daily - 3 x weekly - 3 sets - 10 reps  Seated Shoulder Row with Anchored Resistance - 1 x daily - 3 x weekly - 2 sets - 10 reps  Sidelying Thoracic Rotation with Open Book - 1 x daily - 7 x weekly - 1 sets - 5-10 reps - 5 sec hold  Supine PNF D2 Flexion with Resistance - 1 x daily - 7 x weekly - 1-2 sets - 10 reps  Supine Shoulder Horizontal Abduction with Resistance - 1 x daily - 7 x weekly - 1 sets - 10 reps  Supine Chest Stretch with Elbows Bent - 1 x daily - 7 x weekly - 1 sets - 2-3 reps - 20 seconds hold  Doorway Pec Stretch at 90 Degrees Abduction - 2 x daily - 7 x weekly - 1 sets - 2 reps - 15-20 seconds hold

## 2019-09-06 NOTE — Addendum Note (Signed)
Addended by: Val Riles on: 09/06/2019 10:37 AM   Modules accepted: Orders

## 2019-09-11 ENCOUNTER — Encounter: Payer: Self-pay | Admitting: Physical Therapy

## 2019-09-11 ENCOUNTER — Other Ambulatory Visit: Payer: Self-pay

## 2019-09-11 ENCOUNTER — Ambulatory Visit (INDEPENDENT_AMBULATORY_CARE_PROVIDER_SITE_OTHER): Payer: 59 | Admitting: Physical Therapy

## 2019-09-11 DIAGNOSIS — M6281 Muscle weakness (generalized): Secondary | ICD-10-CM

## 2019-09-11 DIAGNOSIS — R600 Localized edema: Secondary | ICD-10-CM | POA: Diagnosis not present

## 2019-09-11 DIAGNOSIS — M25512 Pain in left shoulder: Secondary | ICD-10-CM

## 2019-09-11 DIAGNOSIS — M25612 Stiffness of left shoulder, not elsewhere classified: Secondary | ICD-10-CM

## 2019-09-11 NOTE — Therapy (Signed)
[place], [place], [address] Phone: [phone]   Fax:  [phone]  Physical Therapy Treatment  Patient Details  Name: Matthew Mccarthy MRN: [id number] Date of Birth: [birth year] Referring Provider (PT): Ophelia Charter   Encounter Date: 09/11/2019  PT End of Session - 09/11/19 0758    Visit Number  17    Number of Visits  28    Date for PT Re-Evaluation  10/18/19    Authorization Type  [employer]    PT Start Time  0758    PT Stop Time  0847    PT Time Calculation (min)  49 min    Behavior During Therapy  [facility] [place] for tasks assessed/performed       Past Medical History:  Diagnosis Date  . Hypertension     Past Surgical History:  Procedure Laterality Date  . NO PAST SURGERIES      There were no vitals filed for this visit.  Subjective Assessment - 09/11/19 0756    Subjective  He reports no pain in Lt shoulder. Abduction is still painful actively. He did some golfing yesterday, use of driver has not been possible "due to lack of Lt arm power". He did not report any pain with golfing activities.    Pertinent History  HTN    Currently in Pain?  No/denies    Pain Score  0-No pain    Pain Onset  More than a month ago    Aggravating Factors   abduction    Pain Relieving Factors  ice         [facility] PT Assessment - 09/11/19 0001      Assessment   Medical Diagnosis  Lt SAD, RCR, biceps tenodesis    Referring Provider (PT)  Dax Griffin Basil    Onset Date/Surgical Date  05/29/19    Hand Dominance  Right    Next MD Visit  10/03/19      AROM   Right/Left Shoulder  Left    Left Shoulder Extension  52 Degrees    Left Shoulder Flexion  154 Degrees    Left Shoulder ABduction  110 Degrees   scaption      [facility] Adult PT Treatment/Exercise - 09/11/19 0001      Shoulder Exercises: Supine   Horizontal ABduction  Both;Strengthening;10 reps    Theraband Level (Shoulder Horizontal ABduction)  Level 3 (Green)     Diagonals  Strengthening;Left;10 reps    Theraband Level (Shoulder Diagonals)  Level 2 (Red)      Shoulder Exercises: Prone   Other Prone Exercises forearm planks, 2 reps, 15 seconds, trial forward/backward rocks, discontinued due to low back tightness      Shoulder Exercises: Standing   External Rotation  Both;10 reps;Theraband    Theraband Level (Shoulder External Rotation)  Level 3 (Green)  Back against pool noodle; VC to keep elbows at side.     Shoulder Exercises: Pulleys   Flexion  2 minutes    Scaption  2 minutes      Shoulder Exercises: ROM/Strengthening   Lat Pull  10 reps   4 plate; cues for form and scap retraction   Cybex Row  10 reps;3 plate      Shoulder Exercises: Stretch   Table Stretch - Flexion  2 reps;20 seconds   childs pose, blocks under hands 2nd rep for form   Other Shoulder Stretches  low and middle doorway stretch x 20 seconds x 2 times; overhead stretch with hands  above door x 2 reps of 10 sec;  Open book x 10 reps with Lt rotation, 5 reps with Rt rotation     Other Shoulder Stretches  flexion, scaption, abduction stretches in supine with over pressure from SPTA 15 seconds, 2 reps each     Vasopneumatic   Number Minutes Vasopneumatic   10 minutes    Vasopnuematic Location   Shoulder   Lt    Vasopneumatic Pressure  Medium    Vasopneumatic Temperature   34 deg       Manual Therapy   Manual Therapy  Soft tissue mobilization; PROM   Manual therapy comments  STM to Lt pec major and minor.   PROM to Lt shoulder into ER, scaption.         PT Long Term Goals - 09/11/19 1610      PT LONG TERM GOAL #1   Title  Ind with HEP for ROM and strength to restore PLOF.    Time  8    Period  Weeks    Status  Achieved      PT LONG TERM GOAL #2   Title  Patient able to perform ADLS with functional ROM and 1/10 pain or less.    Time  8    Period  Weeks    Status  Achieved      PT LONG TERM GOAL #3   Title  Patient able to don/doff clothing without  limitations.    Time  8    Period  Weeks    Status  Achieved      PT LONG TERM GOAL #4   Title  Patient able to sleep without waking from pain.    Time  8    Period  Weeks    Status  Achieved      PT LONG TERM GOAL #5   Title  Patient to demo 4+/5 or better left shoulder strength to restore normal LUE use.    Time  8    Period  Weeks    Status  Partially Met      PT LONG TERM GOAL #6   Title  Patient to demonstrate increased thoracic extension to improve shoulder function    Time  6    Period  Weeks    Status  New      PT LONG TERM GOAL #7   Title  Patient return to golf with modification if needed    Time  [place] - 09/11/19 9604    Clinical Impression Statement  Pt continues to be limited with Lt shoulder abduction AROM.  Pt reports increased pain in Lt superior shoulder at end range of PROM/AAROM/AROM; reduced with rest/ ice. Strength is progressing.  Pt is progressing towards all goals.     Rehab Potential  Excellent    PT Frequency  2x / week    PT Duration  8 weeks    PT Treatment/Interventions  ADLs/Self Care Home Management;Cryotherapy;Electrical Stimulation;Moist Heat;Therapeutic activities;Therapeutic exercise;Neuromuscular re-education;Patient/family education;Manual techniques;Passive range of motion;Taping;Vasopneumatic Device    PT Next Visit Plan  Add in golf like exercises to improve pain free swing, manual work to Lt shoulder to imrove abduc ROM.    PT Home Exercise Plan  [email or web address]; written exercises       Patient will benefit from skilled therapeutic intervention in order to improve the following deficits and  impairments:  Pain, Postural dysfunction, Decreased range of motion, Decreased strength, Impaired UE functional use, Increased edema  Visit Diagnosis: Stiffness of left shoulder, not elsewhere classified  Acute pain of left shoulder  Muscle weakness (generalized)  Localized edema     Problem  List Patient Active Problem List   Diagnosis Date Noted  . Traumatic complete tear of left rotator cuff 05/03/2019  . 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 I have personally read, edited and approved of the note as written.  Kerin Perna, PTA 09/11/19 9:12 AM   [facility] [place], [place], [address] Phone: [phone]   Fax:  [phone]  Name: Matthew Mccarthy MRN: [id number] Date of Birth: [birth year]

## 2019-09-13 ENCOUNTER — Ambulatory Visit (INDEPENDENT_AMBULATORY_CARE_PROVIDER_SITE_OTHER): Payer: 59 | Admitting: Rehabilitative and Restorative Service Providers"

## 2019-09-13 ENCOUNTER — Other Ambulatory Visit: Payer: Self-pay

## 2019-09-13 ENCOUNTER — Encounter: Payer: Self-pay | Admitting: Rehabilitative and Restorative Service Providers"

## 2019-09-13 DIAGNOSIS — M6281 Muscle weakness (generalized): Secondary | ICD-10-CM | POA: Diagnosis not present

## 2019-09-13 DIAGNOSIS — M25512 Pain in left shoulder: Secondary | ICD-10-CM

## 2019-09-13 DIAGNOSIS — R600 Localized edema: Secondary | ICD-10-CM | POA: Diagnosis not present

## 2019-09-13 DIAGNOSIS — M25612 Stiffness of left shoulder, not elsewhere classified: Secondary | ICD-10-CM

## 2019-09-13 NOTE — Therapy (Signed)
[place], [place], [address] Phone: [phone]   Fax:  [phone]  Physical Therapy Treatment  Patient Details  Name: Matthew Mccarthy MRN: [id number] Date of Birth: 03/15/1961 Referring Provider (PT): Ophelia Charter   Encounter Date: 09/13/2019  PT End of Session - 09/13/19 0801    Visit Number  18    Number of Visits  28    Date for PT Re-Evaluation  10/18/19    PT Start Time  7903    PT Stop Time  0846    PT Time Calculation (min)  51 min    Activity Tolerance  Patient tolerated treatment well    Behavior During Therapy  [facility] for tasks assessed/performed       Past Medical History:  Diagnosis Date  . Hypertension     Past Surgical History:  Procedure Laterality Date  . NO PAST SURGERIES      There were no vitals filed for this visit.  Subjective Assessment - 09/13/19 0818    Subjective  Sore from power washing yesterday - workin gon exercises at home    Currently in Pain?  No/denies                       [facility] Adult PT Treatment/Exercise - 09/13/19 0001      Shoulder Exercises: Standing   ABduction Limitations  placing Lt UE hand on wall in scaption and eccentrically walking hand down x 3 reps working on eccentric control    Row Limitations  lat row blue TB over the door pulling down to chest 10 reps x 3 sets VC/TC for technique and to facilitate posterior shoulder girdle       Shoulder Exercises: Pulleys   Flexion  2 minutes    Scaption  2 minutes      Shoulder Exercises: Stretch   Other Shoulder Stretches  lat stretch supine 30 sec x 3 PT assist for two - pt assisting Lt UE with Rt for last rep       Moist Heat Therapy   Number Minutes Moist Heat  10 Minutes    Moist Heat Location  Shoulder   thoracic spine      Manual Therapy   Manual therapy comments  skilled palpation for DN     Soft tissue mobilization  deep tissue work through the upper trap;  teres/lats; posterior deltoid    Myofascial Release  teres/lateral trunk     Passive ROM  Lt shoulder abduction in scapular plane    Manual Traction  Lt arm distraction       Trigger Point Dry Needling - 09/13/19 0001    Consent Given?  Yes    Education Handout Provided  Previously provided    Other Dry Needling  Lt     Upper Trapezius Response  Palpable increased muscle length    Levator Scapulae Response  Palpable increased muscle length    Deltoid Response  Palpable increased muscle length    Latissimus dorsi Response  Palpable increased muscle length    Teres major Response  Palpable increased muscle length    Teres minor Response  Palpable increased muscle length    Biceps Response  Palpable increased muscle length           PT Education - 09/13/19 0850    Education Details  HEP    Person(s) Educated  Patient    Methods  Explanation;Demonstration;Tactile cues;Verbal cues;Handout  Comprehension  Verbalized understanding;Returned demonstration;Verbal cues required;Tactile cues required          PT Long Term Goals - 09/11/19 0852      PT LONG TERM GOAL #1   Title  Ind with HEP for ROM and strength to restore PLOF.    Time  8    Period  Weeks    Status  Achieved      PT LONG TERM GOAL #2   Title  Patient able to perform ADLS with functional ROM and 1/10 pain or less.    Time  8    Period  Weeks    Status  Achieved      PT LONG TERM GOAL #3   Title  Patient able to don/doff clothing without limitations.    Time  8    Period  Weeks    Status  Achieved      PT LONG TERM GOAL #4   Title  Patient able to sleep without waking from pain.    Time  8    Period  Weeks    Status  Achieved      PT LONG TERM GOAL #5   Title  Patient to demo 4+/5 or better left shoulder strength to restore normal LUE use.    Time  8    Period  Weeks    Status  Partially Met      PT LONG TERM GOAL #6   Title  Patient to demonstrate increased thoracic extension to improve  shoulder function    Time  6    Period  Weeks    Status  New      PT LONG TERM GOAL #7   Title  Patient return to golf with modification if needed    Time  6    Period  Weeks    Status  New            Plan - 09/13/19 0802    Clinical Impression Statement  Continued work on ROM and strengthening. Trial of DN to tightness in upper trap/leveator; teres/lats; posterior deltoid. Progressing with strengthening.    Rehab Potential  Excellent    PT Frequency  2x / week    PT Duration  8 weeks    PT Treatment/Interventions  ADLs/Self Care Home Management;Cryotherapy;Electrical Stimulation;Moist Heat;Therapeutic activities;Therapeutic exercise;Neuromuscular re-education;Patient/family education;Manual techniques;Passive range of motion;Taping;Vasopneumatic Device    PT Next Visit Plan  Add lat stretch in supine; pull down row with TB over door; trial of DN to Lt shoulder - lat/biceps; teres; trap; leveator, posterior deltoid    PT Home Exercise Plan  [id number]; written exercises    Consulted and Agree with Plan of Care  Patient       Patient will benefit from skilled therapeutic intervention in order to improve the following deficits and impairments:     Visit Diagnosis: Stiffness of left shoulder, not elsewhere classified  Acute pain of left shoulder  Muscle weakness (generalized)  Localized edema     Problem List Patient Active Problem List   Diagnosis Date Noted  . Traumatic complete tear of left rotator cuff 05/03/2019  . Boutonniere deformity of middle finger, right 04/18/2018     Nilda Simmer PT, MPH  09/13/2019, 8:52 AM  [facility] [place], [place], [address] Phone: [phone]   Fax:  [phone]  Name: Matthew Mccarthy MRN: [id number] Date of Birth: 1961-02-21

## 2019-09-13 NOTE — Patient Instructions (Signed)
   Lying on back bring knees to chest turn hands toward face and push hands up overhead 30 sec x 3 reps - can help stretch Lt with right arm   Lying on left side with ball in arm pit  Wall walk - in scaption - walking fingers down wall slowly    Blue band over door pulling to chest  Focus on middle and lower trap

## 2019-09-20 ENCOUNTER — Other Ambulatory Visit: Payer: Self-pay

## 2019-09-20 ENCOUNTER — Ambulatory Visit (INDEPENDENT_AMBULATORY_CARE_PROVIDER_SITE_OTHER): Payer: 59 | Admitting: Physical Therapy

## 2019-09-20 DIAGNOSIS — M25612 Stiffness of left shoulder, not elsewhere classified: Secondary | ICD-10-CM | POA: Diagnosis not present

## 2019-09-20 DIAGNOSIS — M6281 Muscle weakness (generalized): Secondary | ICD-10-CM

## 2019-09-20 DIAGNOSIS — M25512 Pain in left shoulder: Secondary | ICD-10-CM | POA: Diagnosis not present

## 2019-09-20 NOTE — Therapy (Addendum)
[place], [place], [address] Phone: [phone]   Fax:  [phone]  Physical Therapy Treatment and Discharge Summary  Patient Details  Name: Matthew Mccarthy MRN: [id number] Date of Birth: Aug 14, 1960 Referring Provider (PT): Ophelia Charter   Encounter Date: 09/20/2019  PT End of Session - 09/20/19 0840    Visit Number  19    Number of Visits  28    Date for PT Re-Evaluation  10/18/19    Authorization Type  [employer]    PT Start Time  [phone]    PT Stop Time  0916    PT Time Calculation (min)  34 min    Activity Tolerance  Patient tolerated treatment well    Behavior During Therapy  [facility] for tasks assessed/performed       Past Medical History:  Diagnosis Date  . Hypertension     Past Surgical History:  Procedure Laterality Date  . NO PAST SURGERIES      There were no vitals filed for this visit.  Subjective Assessment - 09/20/19 0846    Subjective  Pt reports he went golfing Sunday. "It's a work in progress." "The only thing that hurt afterwards was my back".  Pt states he can lift arm out to side a little higher.    Patient Stated Goals  get full use of his arm again    Currently in Pain?  No/denies    Pain Score  0-No pain         [facility] PT Assessment - 09/20/19 0001      Assessment   Medical Diagnosis  Lt SAD, RCR, biceps tenodesis    Referring Provider (PT)  Dax Varkey    Onset Date/Surgical Date  05/29/19    Hand Dominance  Right    Next MD Visit  10/03/19      Observation/Other Assessments   Focus on Therapeutic Outcomes (FOTO)   22% limitation (21% goal)       AROM   Left Shoulder Extension  60 Degrees    Left Shoulder Flexion  154 Degrees    Left Shoulder ABduction  125 Degrees    Left Shoulder Internal Rotation  --   thumb to T10   Left Shoulder External Rotation  87 Degrees      Strength   Right/Left Shoulder  Left    Left Shoulder Flexion  4+/5    Left Shoulder Extension   5/5    Left Shoulder ABduction  3+/5   with pain   Left Shoulder Internal Rotation  4+/5    Left Shoulder External Rotation  4+/5       [facility] Adult PT Treatment/Exercise - 09/20/19 0001      Shoulder Exercises: Supine   External Rotation  Strengthening;Both;10 reps    Theraband Level (Shoulder External Rotation)  Level 3 (Green)    Other Supine Exercises  D2 flexion with LUE x 10 reps       Shoulder Exercises: Sidelying   Other Sidelying Exercises  sidelying open book with green band x 10 reps each side.       Shoulder Exercises: Standing   Other Standing Exercises  D2 flexion with yellow band x 3 reps, painful- switched to supine with green with improved tolerance    Other Standing Exercises  shoulder add with green band and eccentric return to abdct (first trial with cable and 1 plate) x 10;       Shoulder Exercises: Pulleys  ABduction  3 minutes   10 sec holds for stretch, then lowering   ABduction Limitations  with eccentric lowering       Shoulder Exercises: ROM/Strengthening   UBE (Upper Arm Bike)  L3: 1 min backward, 1 min forward, while standing      Shoulder Exercises: Stretch   Other Shoulder Stretches  low and midlevel doorway stretch x 15 sec x 3 reps, overhead stretch x 10 sec x 2 reps.     Other Shoulder Stretches  lat stretch supine 30 sec x 1 rep with PTA assist for knees to chest.       Modalities   Modalities  --   pt declined modalities.     verbally reviewed current HEP.         PT Education - 09/20/19 0941    Education Details  pt issued green band for supine exercises.    Person(s) Educated  Patient    Methods  Explanation    Comprehension  Verbalized understanding          PT Long Term Goals - 09/20/19 0944      PT LONG TERM GOAL #1   Title  Ind with HEP for ROM and strength to restore PLOF.    Time  8    Period  Weeks    Status  Achieved      PT LONG TERM GOAL #2   Title  Patient able to perform ADLS with functional ROM and  1/10 pain or less.    Time  8    Period  Weeks    Status  Achieved      PT LONG TERM GOAL #3   Title  Patient able to don/doff clothing without limitations.    Time  8    Period  Weeks    Status  Achieved      PT LONG TERM GOAL #4   Title  Patient able to sleep without waking from pain.    Time  8    Period  Weeks    Status  Achieved      PT LONG TERM GOAL #5   Title  Patient to demo 4+/5 or better left shoulder strength to restore normal LUE use.    Time  8    Period  Weeks    Status  Partially Met      PT LONG TERM GOAL #6   Title  Patient to demonstrate increased thoracic extension to improve shoulder function    Time  6    Period  Weeks    Status  Achieved      PT LONG TERM GOAL #7   Title  Patient return to golf with modification if needed    Time  6    Period  Weeks    Status  Achieved            Plan - 09/20/19 0939    Clinical Impression Statement  Pt demonstrated improved Lt shoulder ROM from last assessment.  His strength is gradually improving; Lt shoulder Abdct continues to be limited.  Pt was unable to tolerate standing D2 flexion with resistance; improved tolerance in supine.  Pt has met most of his goals.  He will be out of town next week; will hold therapy until MD visit.    Rehab Potential  Excellent    PT Frequency  2x / week    PT Duration  8 weeks    PT Treatment/Interventions  ADLs/Self Care Home  Management;Cryotherapy;Electrical Stimulation;Moist Heat;Therapeutic activities;Therapeutic exercise;Neuromuscular re-education;Patient/family education;Manual techniques;Passive range of motion;Taping;Vasopneumatic Device    PT Next Visit Plan  will hold therapy until after MD appt.    PT Home Exercise Plan  [id number]; written exercises    Consulted and Agree with Plan of Care  Patient       Patient will benefit from skilled therapeutic intervention in order to improve the following deficits and impairments:  Pain, Postural dysfunction, Decreased  range of motion, Decreased strength, Impaired UE functional use, Increased edema  Visit Diagnosis: Stiffness of left shoulder, not elsewhere classified  Acute pain of left shoulder  Muscle weakness (generalized)    PHYSICAL THERAPY DISCHARGE SUMMARY  Visits from Start of Care: 19  Current functional level related to goals / functional outcomes: See goals above.   Remaining deficits: Mild deficits in strength and end range of motion. Patient addressing with continued HEP.   Education / Equipment: Home program.  *Did not return after last MD visit.  Plan: Patient agrees to discharge.  Patient goals were not met. Patient is being discharged due to meeting the stated rehab goals.  ?????         Thank you for the referral of this patient. Rudell Cobb, MPT  Kerin Perna, [place] 09/20/19 9:47 AM  [facility] [place], [place], [address] Phone: [phone]   Fax:  [phone]  Name: Matthew Mccarthy MRN: [id number] Date of Birth: [birth year]

## 2019-11-29 ENCOUNTER — Ambulatory Visit (INDEPENDENT_AMBULATORY_CARE_PROVIDER_SITE_OTHER): Payer: 59 | Admitting: Sports Medicine

## 2019-11-29 ENCOUNTER — Other Ambulatory Visit: Payer: Self-pay

## 2019-11-29 DIAGNOSIS — S46111A Strain of muscle, fascia and tendon of long head of biceps, right arm, initial encounter: Secondary | ICD-10-CM | POA: Diagnosis not present

## 2019-11-29 NOTE — Progress Notes (Signed)
    Procedures performed today:    None.  Independent interpretation of notes and tests performed by another provider:   None.  Brief History, Exam, Impression, and Recommendations:    Rupture of right long head biceps tendon Matthew Mccarthy is here with a deformity in his right arm, he moved it and felt a pop, then had an obvious Popeye deformity. He still has excellent strength of flexion, minimal weakness to supination, this is a classic rupture of the long head of the biceps, no further intervention needed.    ___________________________________________ Ihor Austin. Benjamin Stain, M.D., ABFM., CAQSM. Primary Care and Sports Medicine [facility] [facility]  Adjunct Instructor of Family Medicine  [employer]

## 2019-11-29 NOTE — Assessment & Plan Note (Signed)
Matthew Mccarthy is here with a deformity in his right arm, he moved it and felt a pop, then had an obvious Popeye deformity. He still has excellent strength of flexion, minimal weakness to supination, this is a classic rupture of the long head of the biceps, no further intervention needed.

## 2020-04-30 ENCOUNTER — Ambulatory Visit (INDEPENDENT_AMBULATORY_CARE_PROVIDER_SITE_OTHER): Payer: 59

## 2020-04-30 ENCOUNTER — Ambulatory Visit (INDEPENDENT_AMBULATORY_CARE_PROVIDER_SITE_OTHER): Payer: 59 | Admitting: Sports Medicine

## 2020-04-30 ENCOUNTER — Other Ambulatory Visit: Payer: Self-pay

## 2020-04-30 DIAGNOSIS — M7541 Impingement syndrome of right shoulder: Secondary | ICD-10-CM

## 2020-04-30 NOTE — Progress Notes (Signed)
    Procedures performed today:    Procedure: Real-time Ultrasound Guided injection of the right subacromial bursa Device: Samsung HS60  Verbal informed consent obtained.  Time-out conducted.  Noted no overlying erythema, induration, or other signs of local infection.  Skin prepped in a sterile fashion.  Local anesthesia: Topical Ethyl chloride.  With sterile technique and under real time ultrasound guidance: 1 cc Kenalog 40, 1 cc lidocaine, 1 cc bupivacaine injected easily Completed without difficulty  Advised to call if fevers/chills, erythema, induration, drainage, or persistent bleeding.  Images permanently stored and available for review in PACS.  Impression: Technically successful ultrasound guided injection.  Independent interpretation of notes and tests performed by another provider:   None.  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 Subacromial injection, home rehab exercises given, return to see me in 6 weeks, MRI for surgical planning if no better.    ___________________________________________ Ihor Austin. Benjamin Stain, M.D., ABFM., CAQSM. Primary Care and Sports Medicine [place] [facility]  Adjunct Instructor of Family Medicine  [employer]

## 2020-04-30 NOTE — Assessment & Plan Note (Signed)
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 Subacromial injection, home rehab exercises given, return to see me in 6 weeks, MRI for surgical planning if no better.

## 2020-06-13 ENCOUNTER — Ambulatory Visit (INDEPENDENT_AMBULATORY_CARE_PROVIDER_SITE_OTHER): Payer: Managed Care, Other (non HMO) | Admitting: Sports Medicine

## 2020-06-13 ENCOUNTER — Other Ambulatory Visit: Payer: Self-pay

## 2020-06-13 DIAGNOSIS — M7541 Impingement syndrome of right shoulder: Secondary | ICD-10-CM | POA: Diagnosis not present

## 2020-06-13 NOTE — Progress Notes (Signed)
    Procedures performed today:    None.  Independent interpretation of notes and tests performed by another provider:   None.  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 He did have a long head of the biceps rupture with a Popeye sign that resolved completely, he had no further bicipital signs on exam. At the last visit he did have a positive Neer's, Hawkins, and empty can sign so we did a subacromial injection and he returns today doing a lot better. He still has occasional discomfort with certain movements such as pushing himself off of the chair, but otherwise feels like he can live with it, he has good motion, good strength, return to see me as needed, we can certainly pull the trigger for an MRI if he determines his symptoms to be intolerable.    ___________________________________________ Ihor Austin. Benjamin Stain, M.D., ABFM., CAQSM. Primary Care and Sports Medicine [place] [facility]  Adjunct Instructor of Family Medicine  [employer]

## 2020-06-13 NOTE — Assessment & Plan Note (Signed)
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 He did have a long head of the biceps rupture with a Popeye sign that resolved completely, he had no further bicipital signs on exam. At the last visit he did have a positive Neer's, Hawkins, and empty can sign so we did a subacromial injection and he returns today doing a lot better. He still has occasional discomfort with certain movements such as pushing himself off of the chair, but otherwise feels like he can live with it, he has good motion, good strength, return to see me as needed, we can certainly pull the trigger for an MRI if he determines his symptoms to be intolerable.

## 2021-01-06 ENCOUNTER — Ambulatory Visit: Payer: Managed Care, Other (non HMO) | Admitting: Sports Medicine

## 2021-01-06 ENCOUNTER — Encounter: Payer: Self-pay | Admitting: Sports Medicine

## 2021-01-06 ENCOUNTER — Ambulatory Visit (INDEPENDENT_AMBULATORY_CARE_PROVIDER_SITE_OTHER): Payer: Managed Care, Other (non HMO)

## 2021-01-06 ENCOUNTER — Other Ambulatory Visit: Payer: Self-pay

## 2021-01-06 DIAGNOSIS — M7541 Impingement syndrome of right shoulder: Secondary | ICD-10-CM | POA: Diagnosis not present

## 2021-01-06 NOTE — Progress Notes (Signed)
    Procedures performed today:    Procedure: Real-time Ultrasound Guided injection of the right subacromial bursa Device: Samsung HS60  Verbal informed consent obtained.  Time-out conducted.  Noted no overlying erythema, induration, or other signs of local infection.  Skin prepped in a sterile fashion.  Local anesthesia: Topical Ethyl chloride.  With sterile technique and under real time ultrasound guidance: Noted intact rotator cuff, 1 cc Kenalog 40, 1 cc lidocaine, 1 cc bupivacaine injected easily Completed without difficulty  Advised to call if fevers/chills, erythema, induration, drainage, or persistent bleeding.  Images permanently stored and available for review in PACS.  Impression: Technically successful ultrasound guided injection.  Independent interpretation of notes and tests performed by another provider:   None.  Brief History, Exam, Impression, and Recommendations:    Impingement syndrome of shoulder, right Matthew Mccarthy returns, he is a very pleasant 60 year old male, he has known right shoulder impingement symptoms, historically he has done well with occasional subacromial injections, the last of which was in December of last year. More recently he has been throwing a lot of baseball with his son, and putting a lot of heat on the baseball, this has increased his pain, he has impingement signs on exam today, we did a subacromial injection. Continue conditioning exercises, I have recommended more of a side arm throw, return to see me as needed.  Chronic process with exacerbation and pharmacologic intervention  ___________________________________________ Ihor Austin. Benjamin Stain, M.D., ABFM., CAQSM. Primary Care and Sports Medicine  [facility]  Adjunct Instructor of Family Medicine  [employer]

## 2021-01-06 NOTE — Assessment & Plan Note (Signed)
Matthew Mccarthy returns, he is a very pleasant 60 year old male, he has known right shoulder impingement symptoms, historically he has done well with occasional subacromial injections, the last of which was in December of last year. More recently he has been throwing a lot of baseball with his son, and putting a lot of heat on the baseball, this has increased his pain, he has impingement signs on exam today, we did a subacromial injection. Continue conditioning exercises, I have recommended more of a side arm throw, return to see me as needed.

## 2021-04-11 IMAGING — DX DG SHOULDER 2+V*L*
3 series · 3 of 3 positions shown · non-contrast
Comparison: No prior.

CLINICAL DATA: Shoulder pain after moving furniture.

EXAM:
LEFT SHOULDER - 2+ VIEW

[shoulder grashey]
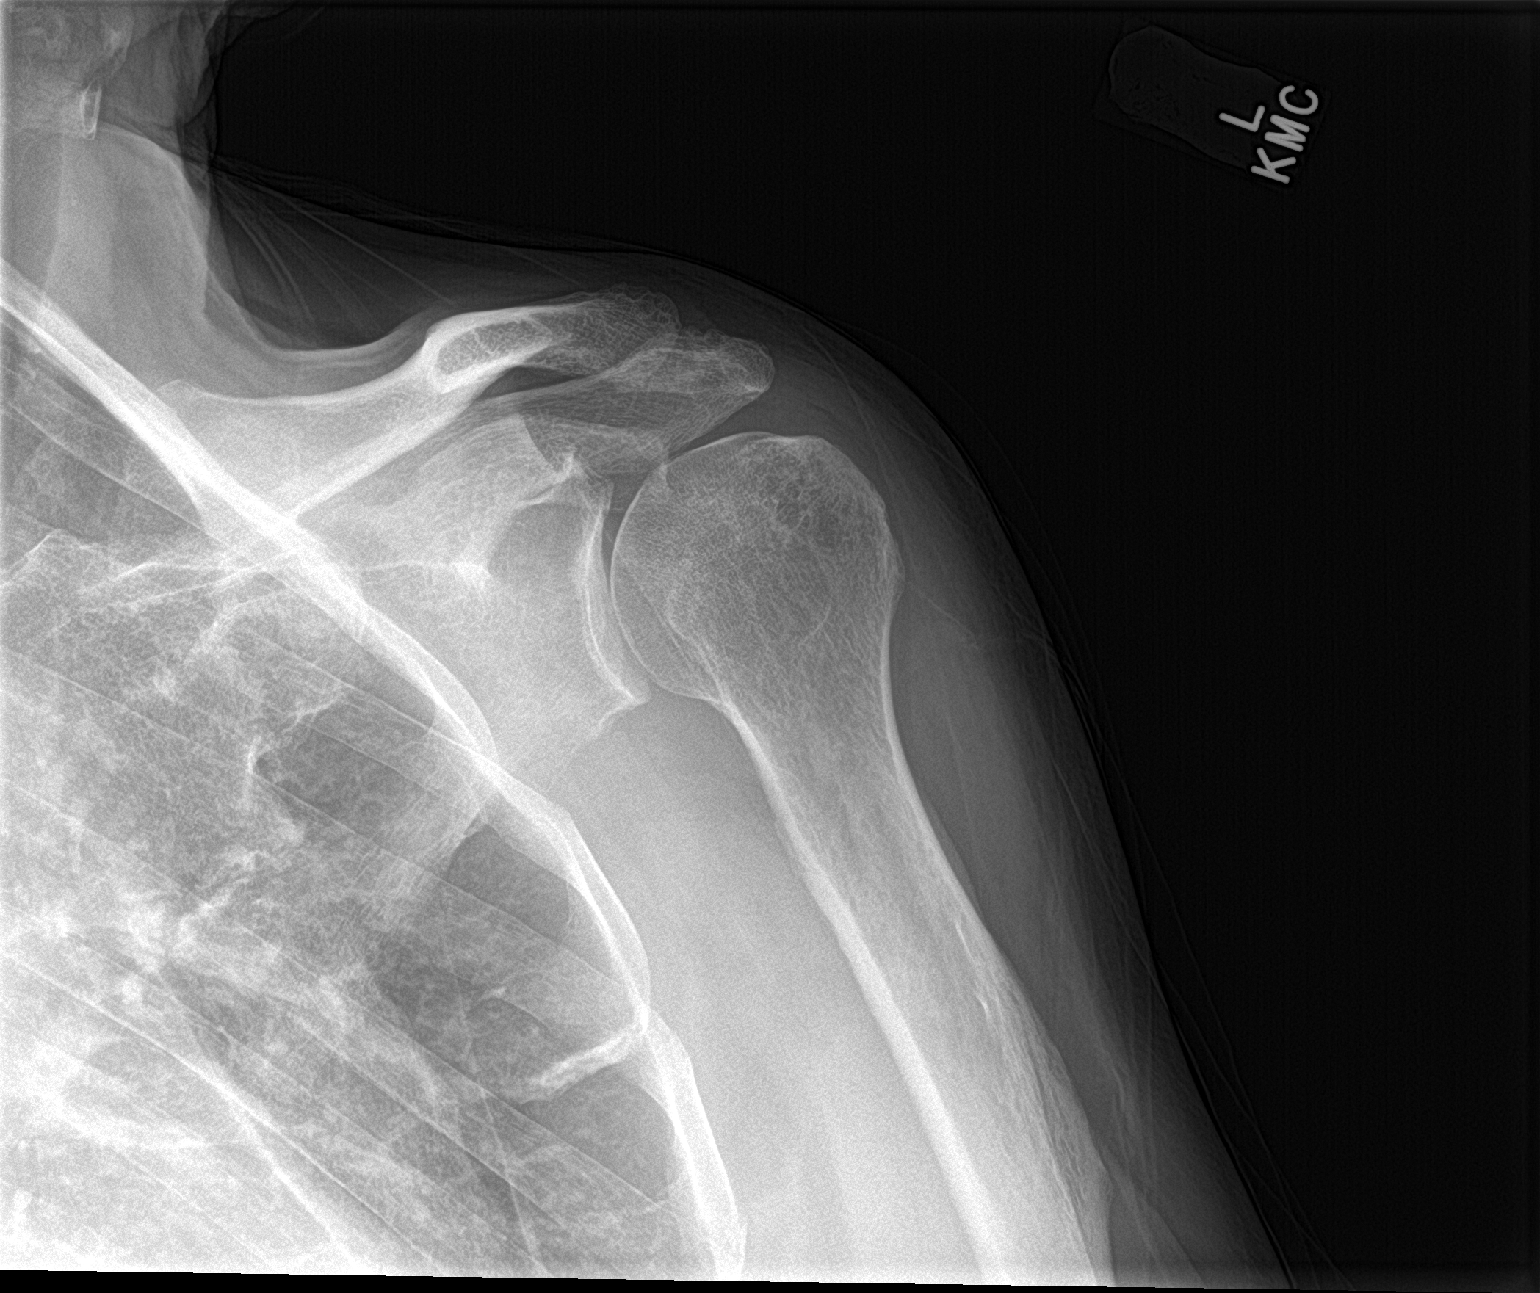

[shoulder y view]
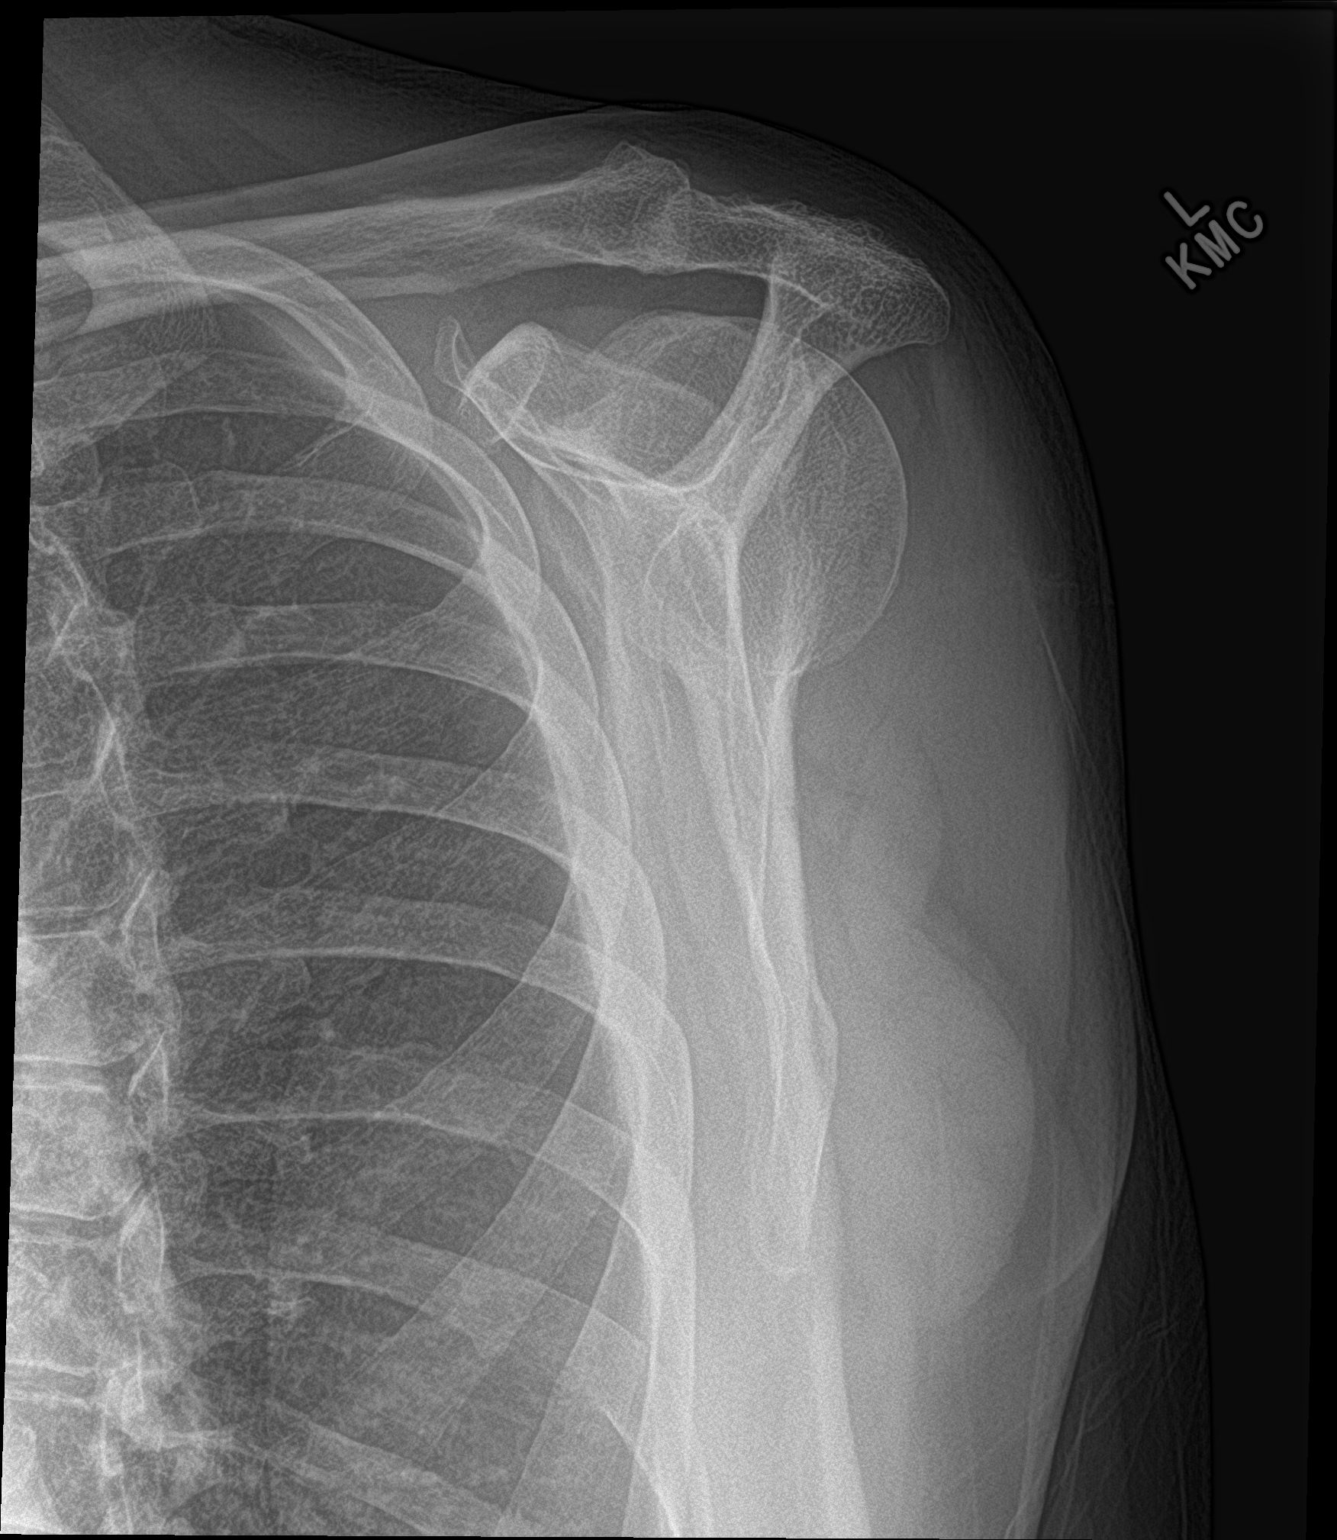

[shoulder axillary]
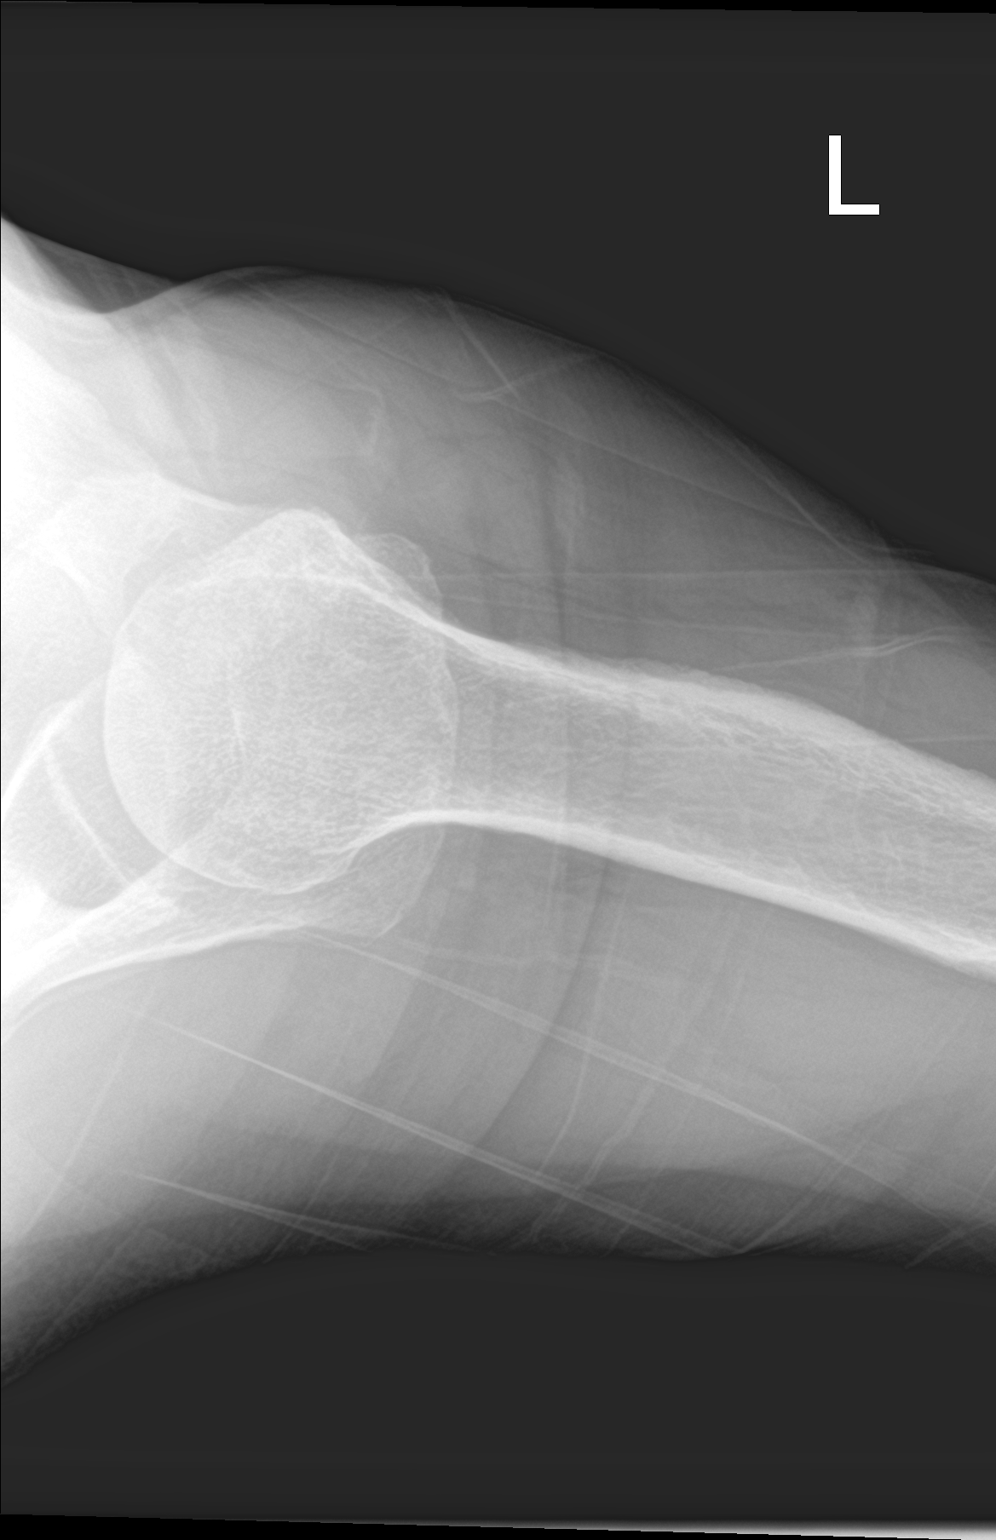

[3 of 3 positions shown; findings below may reference images not displayed]

FINDINGS: Mild acromioclavicular and glenohumeral degenerative change. No
acute bony or joint abnormality identified. No evidence of fracture,
dislocation, or separation.
IMPRESSION: Mild acromioclavicular and glenohumeral degenerative change. No
acute abnormality identified.

## 2022-11-02 ENCOUNTER — Other Ambulatory Visit (INDEPENDENT_AMBULATORY_CARE_PROVIDER_SITE_OTHER): Payer: 59

## 2022-11-02 ENCOUNTER — Ambulatory Visit (INDEPENDENT_AMBULATORY_CARE_PROVIDER_SITE_OTHER): Payer: 59 | Admitting: Sports Medicine

## 2022-11-02 DIAGNOSIS — S46012D Strain of muscle(s) and tendon(s) of the rotator cuff of left shoulder, subsequent encounter: Secondary | ICD-10-CM

## 2022-11-02 DIAGNOSIS — M7541 Impingement syndrome of right shoulder: Secondary | ICD-10-CM

## 2022-11-02 NOTE — Assessment & Plan Note (Signed)
Pleasant 62 year old male, he has chronic right shoulder pain, he does have a history of left-sided rotator cuff tear with reconstruction by Dr. Everardo Pacific. He did well couple years ago with conservative treatment including physical therapy and a subacromial injection, now 2 years later having recurrence of discomfort, impingement signs on exam, he request another injection, we did a subacromial bursa injection today, he will restart his home physical therapy, return to see me in 6 weeks as needed.

## 2022-11-02 NOTE — Progress Notes (Signed)
    Procedures performed today:    Procedure: Real-time Ultrasound Guided injection of the right subacromial bursa Device: Samsung HS60  Verbal informed consent obtained.  Time-out conducted.  Noted no overlying erythema, induration, or other signs of local infection.  Skin prepped in a sterile fashion.  Local anesthesia: Topical Ethyl chloride.  With sterile technique and under real time ultrasound guidance: Noted minimal partial-thickness rotator cuff tearing of the supraspinatus near the insertion, 1 cc Kenalog 40, 1 cc lidocaine, 1 cc bupivacaine injected easily Completed without difficulty  Advised to call if fevers/chills, erythema, induration, drainage, or persistent bleeding.  Images permanently stored and available for review in PACS.  Impression: Technically successful ultrasound guided injection.  Independent interpretation of notes and tests performed by another provider:   None.  Brief History, Exam, Impression, and Recommendations:    Impingement syndrome of shoulder, right Pleasant 62 year old male, he has chronic right shoulder pain, he does have a history of left-sided rotator cuff tear with reconstruction by Dr. Everardo Pacific. He did well couple years ago with conservative treatment including physical therapy and a subacromial injection, now 2 years later having recurrence of discomfort, impingement signs on exam, he request another injection, we did a subacromial bursa injection today, he will restart his home physical therapy, return to see me in 6 weeks as needed.    ____________________________________________ Ihor Austin. Benjamin Stain, M.D., ABFM., CAQSM., AME. Primary Care and Sports Medicine [place] [facility]  Adjunct Professor of Family Medicine  [employer]  Restaurant manager, fast food

## 2022-12-11 ENCOUNTER — Ambulatory Visit: Payer: No Typology Code available for payment source | Admitting: Sports Medicine

## 2024-01-20 ENCOUNTER — Encounter: Payer: Self-pay | Admitting: Sports Medicine

## 2024-04-03 ENCOUNTER — Telehealth: Payer: Self-pay

## 2024-04-03 DIAGNOSIS — S46012D Strain of muscle(s) and tendon(s) of the rotator cuff of left shoulder, subsequent encounter: Secondary | ICD-10-CM

## 2024-04-03 DIAGNOSIS — M7541 Impingement syndrome of right shoulder: Secondary | ICD-10-CM

## 2024-04-03 NOTE — Telephone Encounter (Signed)
 Copied from CRM #[id number]. Topic: Appointments - Scheduling Inquiry for Clinic >> Mar 31, 2024  1:55 PM Matthew Mccarthy wrote: Reason for CRM: Patient is requesting an injection for a torn rotator cuff - was a patient of Dr. Nathalie. Please advise since no longer at practice, ty. # [phone]

## 2024-04-04 ENCOUNTER — Ambulatory Visit (HOSPITAL_BASED_OUTPATIENT_CLINIC_OR_DEPARTMENT_OTHER)
Admission: RE | Admit: 2024-04-04 | Discharge: 2024-04-04 | Disposition: A | Discharge status: Home / Self Care | Source: Ambulatory Visit

## 2024-04-04 ENCOUNTER — Ambulatory Visit: Payer: Self-pay

## 2024-04-04 ENCOUNTER — Other Ambulatory Visit: Payer: Self-pay

## 2024-04-04 ENCOUNTER — Ambulatory Visit

## 2024-04-04 VITALS — BP 142/80 | Ht 69.0 in | Wt 194.0 lb

## 2024-04-04 DIAGNOSIS — M25511 Pain in right shoulder: Secondary | ICD-10-CM | POA: Insufficient documentation

## 2024-04-04 DIAGNOSIS — G8929 Other chronic pain: Secondary | ICD-10-CM | POA: Insufficient documentation

## 2024-04-04 DIAGNOSIS — M7541 Impingement syndrome of right shoulder: Secondary | ICD-10-CM

## 2024-04-04 MED ORDER — METHYLPREDNISOLONE ACETATE 40 MG/ML IJ SUSP
40.0000 mg | Freq: Once | INTRAMUSCULAR | Status: AC
  Administered 2024-04-04: 40 mg via INTRA_ARTICULAR

## 2024-04-04 NOTE — Progress Notes (Signed)
 Subjective:    Patient ID: Matthew Mccarthy, male    DOB: 63 y.o., 1960/09/29   MRN: [id number]  Chief Complaint: Bilateral shoulder pain  Discussed the use of AI scribe software for clinical note transcription with the patient, who gave verbal consent to proceed.  History of Present Illness Patient is a former patient of Dr. Curtis.  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 In his left shoulder he was noted to have a full-thickness retracted supraspinatus tendon tear in 2020 on MRI with repair done by Dr. Cristy. Has done well historically with occasional subacromial injections though since patient is right-hand-dominant and he has been throwing baseball a lot with his son, it has flared up somewhat more recently. Last subacromial injection and right shoulder 10/2022. Reports that pain resurfaced for him over the past several months  Review of pertinent imaging: 05/07/2019 Left shoulder MRI IMPRESSION: 1. Full-thickness retracted supraspinatus tendon tear as detailed above. 2. Subscapularis tendinopathy with interstitial tears and probable shallow articular surface tears but no definite full-thickness retracted tear. 3. Intact long head biceps tendon and glenoid labrum. 4. Moderate AC joint degenerative changes.    Objective:   Vitals:   04/04/24 0843  BP: (!) 142/80   Right shoulder ( compared to normal ) Inspection: - swelling, equivocal scapular dyskinesis Palpation: TTP - greater tuberosity, - AC joint, + biceps tendon, + posterior shoulder AROM/PROM: 165 forward flexion, 160 abduction, 45 external rotation, internal rotation to lumbar spine Strength: 5/5 lift off, 4+ /5 empty can, 4+ /5 external rotation, 5/5 flexion, + drop arm test Special tests:    -Rotator Cuff: + Neer's, + Hawkin's, + empty can, + painful arc   -Labrum: - O'brien's, - Jerk   -Biceps: + speed's, - yergason's    -AC  Joint: + cross arm testing     -Instability: - external rotation/apprehension/relocation test, - sulcus sign   Limited ultrasound evaluation of the right shoulder: Prominent calcifications present in the subscapularis muscle. Scant fibers still attached in the biceps tendon.  Vast majority of fibers absent in the bicipital groove. Supraspinatus tendon with hypoechogenic fluid signal tracking between regular fiber architecture near the footprint raising concern for full-thickness rotator cuff tear. Interpretation: Possible full-thickness rotator cuff tear of the supraspinatus Calcific tendinosis of the subscapularis muscle Near complete tearing of the biceps tendon.  Subacromial Space Injection with Ultrasound Guidance Procedure Note Matthew Mccarthy 62-26-1962 Indications: Pain Procedure Details Verbal consent was obtained from the patient. Risks, benefits, and alternatives were explained. The right subacromial space and subacromial/subdeltoid bursa was identified on US . Patient prepped with Chloraprep and Ethyl Chloride used for anesthesia. The patient was then injected using a lateral approach with a solution of 40mg  Depo-Medrol, 3cc Mepivacaine 2%, 0.5cc Sodium Bicarbonate 8.4%. The patient tolerated the procedure well and had decreased pain post injection. No complications. Please see applicable images in the medical record.     Assessment & Plan:   Assessment & Plan  Matthew Mccarthy is a very pleasant 63 year old right-hand-dominant male presenting for follow-up on right shoulder pain previously categorized as shoulder impingement.  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  The complexity and number of these issues raises the question of whether he would benefit from surgery and thus I will  obtain an  x-ray of his right shoulder which we have not yet obtained, and will also order an MRI.  I also provided an subacromial injection today under ultrasound guidance per patient preference.

## 2024-04-11 ENCOUNTER — Ambulatory Visit (HOSPITAL_BASED_OUTPATIENT_CLINIC_OR_DEPARTMENT_OTHER)
Admission: RE | Admit: 2024-04-11 | Discharge: 2024-04-11 | Disposition: A | Discharge status: Home / Self Care | Source: Ambulatory Visit

## 2024-04-11 DIAGNOSIS — G8929 Other chronic pain: Secondary | ICD-10-CM | POA: Insufficient documentation

## 2024-04-11 DIAGNOSIS — M7541 Impingement syndrome of right shoulder: Secondary | ICD-10-CM | POA: Diagnosis present

## 2024-04-11 DIAGNOSIS — M25511 Pain in right shoulder: Secondary | ICD-10-CM | POA: Insufficient documentation

## 2024-04-19 ENCOUNTER — Telehealth

## 2024-04-19 VITALS — Ht 69.0 in | Wt 194.0 lb

## 2024-04-19 DIAGNOSIS — M7541 Impingement syndrome of right shoulder: Secondary | ICD-10-CM

## 2024-04-19 DIAGNOSIS — G8929 Other chronic pain: Secondary | ICD-10-CM

## 2024-04-19 DIAGNOSIS — M25511 Pain in right shoulder: Secondary | ICD-10-CM

## 2024-04-19 DIAGNOSIS — M24111 Other articular cartilage disorders, right shoulder: Secondary | ICD-10-CM | POA: Diagnosis not present

## 2024-04-19 DIAGNOSIS — S46211A Strain of muscle, fascia and tendon of other parts of biceps, right arm, initial encounter: Secondary | ICD-10-CM

## 2024-04-19 NOTE — Progress Notes (Signed)
    Patient ID: Matthew Mccarthy, male    DOB: 63 y.o., 11/06/1960   MRN: [id number]   Chief Complaint: Video Visit for MRI Result Review  Patient Location: [place], [place] Provider Location: [place], [place]    Discussed the use of AI scribe software for clinical note transcription with the patient, who gave verbal consent to proceed.  History of Present Illness Matthew Mccarthy is a 63 year old male who presents for follow-up of shoulder pain after receiving a cortisone injection.  Right shoulder pain - Two days of increased right shoulder pain following cortisone injection, which is atypical for him - Currently 100% pain-free in the right shoulder - No formal home exercise or therapy performed  Activity and functional impact - Drives 4.5 hours using right hand - Plays golf five times per week, which impacts right shoulder  04/11/2024 right shoulder MRI IMPRESSION: 1. Complete rupture of the long head of the biceps tendon with distal retraction into the bicipital groove. 2. Supraspinatus and subscapularis tendinosis with mild bursal surface fraying of the distal supraspinatus tendon and adjacent bursal fluid. No full-thickness rotator cuff tear, tendon retraction or muscular atrophy. 3. Degenerative tear of the posterosuperior labrum. 4. Moderate acromioclavicular degenerative changes with a moderate amount of fluid anteriorly in the subacromial-subdeltoid bursa. 5. Underlying heterogeneous marrow signal within the proximal humerus associated with circumferential cortical thickening, incompletely visualized but suggesting underlying fibrous dysplasia. Suggest correlation with right humerus radiographs. 6. Prominent T2 hyperintensity within the greater tuberosity which in the absence of recent trauma is probably reactive.  could reflect a nondisplaced fracture if recent trauma.  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 Despite concerning MRI findings, clinical improvement is prioritized. No pain is reported post-injection, indicating successful treatment. There is no evidence of a full thickness tear of the rotator cuff repair, and the biceps tendon tear is not causing significant issues. A list of exercises and stretches was provided to be performed at home at least two days a week. Follow up as needed.
